# Patient Record
Sex: Female | Born: 1990 | Race: White | Hispanic: No | Marital: Single | Smoking: Never smoker
Health system: Southern US, Community
[De-identification: ages and names within clinical notes are randomized; demographics above are authoritative.]

## PROBLEM LIST (undated history)

## (undated) ENCOUNTER — Inpatient Hospital Stay (HOSPITAL_COMMUNITY): Payer: Self-pay

## (undated) DIAGNOSIS — G43909 Migraine, unspecified, not intractable, without status migrainosus: Secondary | ICD-10-CM

## (undated) DIAGNOSIS — R51 Headache: Secondary | ICD-10-CM

## (undated) DIAGNOSIS — I8393 Asymptomatic varicose veins of bilateral lower extremities: Secondary | ICD-10-CM

## (undated) DIAGNOSIS — D649 Anemia, unspecified: Secondary | ICD-10-CM

## (undated) HISTORY — PX: WISDOM TOOTH EXTRACTION: SHX21

## (undated) HISTORY — PX: ROOT CANAL: SHX2363

---

## 2011-05-30 ENCOUNTER — Other Ambulatory Visit: Payer: Self-pay | Admitting: Sports Medicine

## 2011-05-30 DIAGNOSIS — M25572 Pain in left ankle and joints of left foot: Secondary | ICD-10-CM

## 2011-05-31 ENCOUNTER — Other Ambulatory Visit: Payer: Self-pay

## 2011-06-03 ENCOUNTER — Ambulatory Visit
Admission: RE | Admit: 2011-06-03 | Discharge: 2011-06-03 | Disposition: A | Discharge status: Home / Self Care | Payer: BC Managed Care – PPO | Source: Ambulatory Visit | Attending: Sports Medicine | Admitting: Sports Medicine

## 2011-06-03 DIAGNOSIS — M25572 Pain in left ankle and joints of left foot: Secondary | ICD-10-CM

## 2011-09-25 ENCOUNTER — Encounter (HOSPITAL_BASED_OUTPATIENT_CLINIC_OR_DEPARTMENT_OTHER): Payer: Self-pay | Admitting: *Deleted

## 2011-10-03 ENCOUNTER — Ambulatory Visit (HOSPITAL_BASED_OUTPATIENT_CLINIC_OR_DEPARTMENT_OTHER): Payer: BC Managed Care – PPO | Admitting: Certified Registered Nurse Anesthetist

## 2011-10-03 ENCOUNTER — Encounter (HOSPITAL_BASED_OUTPATIENT_CLINIC_OR_DEPARTMENT_OTHER): Payer: Self-pay | Admitting: Certified Registered Nurse Anesthetist

## 2011-10-03 ENCOUNTER — Ambulatory Visit (HOSPITAL_BASED_OUTPATIENT_CLINIC_OR_DEPARTMENT_OTHER)
Admission: RE | Admit: 2011-10-03 | Discharge: 2011-10-03 | Disposition: A | Discharge status: Home / Self Care | Payer: BC Managed Care – PPO | Source: Ambulatory Visit | Attending: Orthopedic Surgery | Admitting: Orthopedic Surgery

## 2011-10-03 ENCOUNTER — Encounter (HOSPITAL_BASED_OUTPATIENT_CLINIC_OR_DEPARTMENT_OTHER): Payer: Self-pay

## 2011-10-03 ENCOUNTER — Encounter (HOSPITAL_BASED_OUTPATIENT_CLINIC_OR_DEPARTMENT_OTHER)
Admission: RE | Disposition: A | Discharge status: Home / Self Care | Payer: Self-pay | Source: Ambulatory Visit | Attending: Orthopedic Surgery

## 2011-10-03 DIAGNOSIS — M25373 Other instability, unspecified ankle: Secondary | ICD-10-CM

## 2011-10-03 DIAGNOSIS — M24176 Other articular cartilage disorders, unspecified foot: Secondary | ICD-10-CM | POA: Insufficient documentation

## 2011-10-03 DIAGNOSIS — M249 Joint derangement, unspecified: Secondary | ICD-10-CM | POA: Insufficient documentation

## 2011-10-03 HISTORY — DX: Headache: R51

## 2011-10-03 HISTORY — PX: ANKLE RECONSTRUCTION: SHX1151

## 2011-10-03 SURGERY — RECONSTRUCTION, ANKLE
Anesthesia: General | Site: Ankle | Laterality: Left | Wound class: Clean

## 2011-10-03 MED ORDER — LIDOCAINE HCL (CARDIAC) 20 MG/ML IV SOLN
INTRAVENOUS | Status: DC | PRN
  Administered 2011-10-03: 40 mg via INTRAVENOUS

## 2011-10-03 MED ORDER — SODIUM CHLORIDE 0.9 % IV SOLN: INTRAVENOUS | Status: DC

## 2011-10-03 MED ORDER — ONDANSETRON HCL 4 MG/2ML IJ SOLN
INTRAMUSCULAR | Status: DC | PRN
  Administered 2011-10-03: 4 mg via INTRAVENOUS

## 2011-10-03 MED ORDER — MIDAZOLAM HCL 5 MG/5ML IJ SOLN
INTRAMUSCULAR | Status: DC | PRN
  Administered 2011-10-03: 1 mg via INTRAVENOUS

## 2011-10-03 MED ORDER — FENTANYL CITRATE 0.05 MG/ML IJ SOLN
50.0000 ug | INTRAMUSCULAR | Status: DC | PRN
  Administered 2011-10-03: 100 ug via INTRAVENOUS

## 2011-10-03 MED ORDER — OXYCODONE HCL 5 MG PO TABS: 5.0000 mg | ORAL_TABLET | ORAL | Status: AC | PRN

## 2011-10-03 MED ORDER — SUCCINYLCHOLINE CHLORIDE 20 MG/ML IJ SOLN
INTRAMUSCULAR | Status: DC | PRN
  Administered 2011-10-03: 80 mg via INTRAVENOUS

## 2011-10-03 MED ORDER — CHLORHEXIDINE GLUCONATE 4 % EX LIQD: 60.0000 mL | Freq: Once | CUTANEOUS | Status: DC

## 2011-10-03 MED ORDER — LIDOCAINE HCL 1 % IJ SOLN
INTRAMUSCULAR | Status: DC | PRN
  Administered 2011-10-03: 2 mL via INTRADERMAL

## 2011-10-03 MED ORDER — PROMETHAZINE HCL 12.5 MG PO TABS: 12.5000 mg | ORAL_TABLET | Freq: Four times a day (QID) | ORAL | Status: DC | PRN

## 2011-10-03 MED ORDER — MIDAZOLAM HCL 2 MG/2ML IJ SOLN
0.5000 mg | INTRAMUSCULAR | Status: DC | PRN
  Administered 2011-10-03: 2 mg via INTRAVENOUS

## 2011-10-03 MED ORDER — CEFAZOLIN SODIUM-DEXTROSE 2-3 GM-% IV SOLR
2.0000 g | INTRAVENOUS | Status: AC
  Administered 2011-10-03: 1 g via INTRAVENOUS

## 2011-10-03 MED ORDER — LIDOCAINE-EPINEPHRINE 1.5-1:200000 % IJ SOLN
INTRAMUSCULAR | Status: DC | PRN
  Administered 2011-10-03: 20 mL via INTRADERMAL

## 2011-10-03 MED ORDER — ASCRIPTIN 325 MG PO TABS: 1.0000 | ORAL_TABLET | Freq: Two times a day (BID) | ORAL | Status: DC

## 2011-10-03 MED ORDER — LACTATED RINGERS IV SOLN
INTRAVENOUS | Status: DC
  Administered 2011-10-03 (×3): via INTRAVENOUS

## 2011-10-03 MED ORDER — DEXAMETHASONE SODIUM PHOSPHATE 4 MG/ML IJ SOLN
INTRAMUSCULAR | Status: DC | PRN
  Administered 2011-10-03: 10 mg via INTRAVENOUS

## 2011-10-03 MED ORDER — ROPIVACAINE HCL 5 MG/ML IJ SOLN
INTRAMUSCULAR | Status: DC | PRN
  Administered 2011-10-03: 20 mL via EPIDURAL

## 2011-10-03 MED ORDER — PROPOFOL 10 MG/ML IV EMUL
INTRAVENOUS | Status: DC | PRN
  Administered 2011-10-03: 200 mg via INTRAVENOUS

## 2011-10-03 SURGICAL SUPPLY — 70 items
3.5 x 10mm suture anchor, corkscrew (Anchor) ×4 IMPLANT
BAG DECANTER FOR FLEXI CONT (MISCELLANEOUS) IMPLANT
BANDAGE ESMARK 6X9 LF (GAUZE/BANDAGES/DRESSINGS) ×1 IMPLANT
BLADE SURG 15 STRL LF DISP TIS (BLADE) ×2 IMPLANT
BLADE SURG 15 STRL SS (BLADE) ×2
BNDG COHESIVE 4X5 TAN STRL (GAUZE/BANDAGES/DRESSINGS) ×2 IMPLANT
BNDG COHESIVE 6X5 TAN STRL LF (GAUZE/BANDAGES/DRESSINGS) ×2 IMPLANT
BNDG ESMARK 4X9 LF (GAUZE/BANDAGES/DRESSINGS) IMPLANT
BNDG ESMARK 6X9 LF (GAUZE/BANDAGES/DRESSINGS) ×2
CHLORAPREP W/TINT 26ML (MISCELLANEOUS) ×2 IMPLANT
COVER TABLE BACK 60X90 (DRAPES) ×2 IMPLANT
CUFF TOURNIQUET SINGLE 18IN (TOURNIQUET CUFF) IMPLANT
CUFF TOURNIQUET SINGLE 34IN LL (TOURNIQUET CUFF) ×2 IMPLANT
DECANTER SPIKE VIAL GLASS SM (MISCELLANEOUS) IMPLANT
DRAPE EXTREMITY T 121X128X90 (DRAPE) ×2 IMPLANT
DRAPE INCISE IOBAN 66X45 STRL (DRAPES) IMPLANT
DRAPE OEC MINIVIEW 54X84 (DRAPES) IMPLANT
DRAPE SURG 17X23 STRL (DRAPES) IMPLANT
DRAPE U-SHAPE 47X51 STRL (DRAPES) ×2 IMPLANT
DRAPE UTILITY W/TAPE 26X15 (DRAPES) ×2 IMPLANT
DRSG EMULSION OIL 3X3 NADH (GAUZE/BANDAGES/DRESSINGS) ×2 IMPLANT
DRSG PAD ABDOMINAL 8X10 ST (GAUZE/BANDAGES/DRESSINGS) ×4 IMPLANT
ELECT REM PT RETURN 9FT ADLT (ELECTROSURGICAL) ×2
ELECTRODE REM PT RTRN 9FT ADLT (ELECTROSURGICAL) ×1 IMPLANT
GLOVE BIO SURGEON STRL SZ 6.5 (GLOVE) ×2 IMPLANT
GLOVE BIO SURGEON STRL SZ8 (GLOVE) ×2 IMPLANT
GLOVE BIOGEL PI IND STRL 7.0 (GLOVE) ×1 IMPLANT
GLOVE BIOGEL PI IND STRL 8 (GLOVE) ×1 IMPLANT
GLOVE BIOGEL PI INDICATOR 7.0 (GLOVE) ×1
GLOVE BIOGEL PI INDICATOR 8 (GLOVE) ×1
GLOVE EXAM NITRILE PF MED BLUE (GLOVE) ×2 IMPLANT
GOWN PREVENTION PLUS XLARGE (GOWN DISPOSABLE) ×2 IMPLANT
GOWN PREVENTION PLUS XXLARGE (GOWN DISPOSABLE) ×2 IMPLANT
KWIRE 4.0 X .062IN (WIRE) IMPLANT
NDL SUT 6 .5 CRC .975X.05 MAYO (NEEDLE) ×1 IMPLANT
NEEDLE HYPO 22GX1.5 SAFETY (NEEDLE) IMPLANT
NEEDLE MAYO TAPER (NEEDLE) ×1
PACK BASIN DAY SURGERY FS (CUSTOM PROCEDURE TRAY) ×2 IMPLANT
PAD CAST 4YDX4 CTTN HI CHSV (CAST SUPPLIES) ×1 IMPLANT
PADDING CAST ABS 4INX4YD NS (CAST SUPPLIES) ×1
PADDING CAST ABS COTTON 4X4 ST (CAST SUPPLIES) ×1 IMPLANT
PADDING CAST COTTON 4X4 STRL (CAST SUPPLIES) ×1
PADDING CAST COTTON 6X4 STRL (CAST SUPPLIES) ×2 IMPLANT
PENCIL BUTTON HOLSTER BLD 10FT (ELECTRODE) ×2 IMPLANT
SHEET MEDIUM DRAPE 40X70 STRL (DRAPES) ×2 IMPLANT
SPLINT FAST PLASTER 5X30 (CAST SUPPLIES)
SPLINT PLASTER CAST FAST 5X30 (CAST SUPPLIES) IMPLANT
SPONGE GAUZE 4X4 12PLY (GAUZE/BANDAGES/DRESSINGS) ×2 IMPLANT
SPONGE LAP 18X18 X RAY DECT (DISPOSABLE) ×2 IMPLANT
STAPLER VISISTAT 35W (STAPLE) IMPLANT
STOCKINETTE 6  STRL (DRAPES) ×1
STOCKINETTE 6 STRL (DRAPES) ×1 IMPLANT
STRIP CLOSURE SKIN 1/2X4 (GAUZE/BANDAGES/DRESSINGS) ×2 IMPLANT
SUCTION FRAZIER TIP 10 FR DISP (SUCTIONS) ×2 IMPLANT
SUT ETHIBOND 0 MO6 C/R (SUTURE) IMPLANT
SUT ETHIBOND 2 OS 4 DA (SUTURE) IMPLANT
SUT MNCRL AB 3-0 PS2 18 (SUTURE) ×2 IMPLANT
SUT MNCRL AB 4-0 PS2 18 (SUTURE) IMPLANT
SUT PROLENE 3 0 PS 2 (SUTURE) ×2 IMPLANT
SUT VIC AB 0 CT1 27 (SUTURE)
SUT VIC AB 0 CT1 27XBRD ANBCTR (SUTURE) IMPLANT
SUT VIC AB 0 SH 27 (SUTURE) ×2 IMPLANT
SUT VIC AB 2-0 SH 18 (SUTURE) IMPLANT
SUT VIC AB 2-0 SH 27 (SUTURE)
SUT VIC AB 2-0 SH 27XBRD (SUTURE) IMPLANT
SUT VICRYL 4-0 PS2 18IN ABS (SUTURE) IMPLANT
SYR BULB 3OZ (MISCELLANEOUS) ×2 IMPLANT
TUBE CONNECTING 20X1/4 (TUBING) ×2 IMPLANT
UNDERPAD 30X30 INCONTINENT (UNDERPADS AND DIAPERS) ×2 IMPLANT
WATER STERILE IRR 1000ML POUR (IV SOLUTION) IMPLANT

## 2011-10-03 NOTE — Anesthesia Preprocedure Evaluation (Signed)
Anesthesia Evaluation  Patient identified by MRN, date of birth, ID band Patient awake    Reviewed: Allergy & Precautions, H&P , NPO status , Patient's Chart, lab work & pertinent test results, reviewed documented beta blocker date and time   Airway Mallampati: II TM Distance: >3 FB Neck ROM: full    Dental   Pulmonary neg pulmonary ROS,          Cardiovascular negative cardio ROS      Neuro/Psych negative neurological ROS  negative psych ROS   GI/Hepatic negative GI ROS, Neg liver ROS,   Endo/Other  negative endocrine ROS  Renal/GU negative Renal ROS  negative genitourinary   Musculoskeletal   Abdominal   Peds  Hematology negative hematology ROS (+)   Anesthesia Other Findings See surgeon's H&P   Reproductive/Obstetrics negative OB ROS                           Anesthesia Physical Anesthesia Plan  ASA: I  Anesthesia Plan: General   Post-op Pain Management:    Induction: Intravenous  Airway Management Planned: LMA  Additional Equipment:   Intra-op Plan:   Post-operative Plan: Extubation in OR  Informed Consent: I have reviewed the patients History and Physical, chart, labs and discussed the procedure including the risks, benefits and alternatives for the proposed anesthesia with the patient or authorized representative who has indicated his/her understanding and acceptance.     Plan Discussed with: CRNA and Surgeon  Anesthesia Plan Comments:         Anesthesia Quick Evaluation  

## 2011-10-03 NOTE — Discharge Instructions (Addendum)
Regional Anesthesia Blocks  1. Numbness or the inability to move the "blocked" extremity may last from 3-48 hours after placement. The length of time depends on the medication injected and your individual response to the medication. If the numbness is not going away after 48 hours, call your surgeon.  2. The extremity that is blocked will need to be protected until the numbness is gone and the  Strength has returned. Because you cannot feel it, you will need to take extra care to avoid injury. Because it may be weak, you may have difficulty moving it or using it. You may not know what position it is in without looking at it while the block is in effect.  3. For blocks in the legs and feet, returning to weight bearing and walking needs to be done carefully. You will need to wait until the numbness is entirely gone and the strength has returned. You should be able to move your leg and foot normally before you try and bear weight or walk. You will need someone to be with you when you first try to ensure you do not fall and possibly risk injury.  4. Bruising and tenderness at the needle site are common side effects and will resolve in a few days.  5. Persistent numbness or new problems with movement should be communicated to the surgeon or the [facility] [phone]).     Toni Arthurs, MD [facility]  Please read the following information regarding your care after surgery.  Medications  You only need a prescription for the narcotic pain medicine (ex. oxycodone, Percocet, Norco).  All of the other medicines listed below are available over the counter. X acetominophen (Tylenol) 650 mg every 4-6 hours as you need for minor pain X oxycodone as prescribed for moderate to severe pain X phenergan as needed for nausea   Narcotic pain medicine (ex. oxycodone, Percocet, Vicodin) will cause constipation.  To prevent this problem, take the following medicines while you are taking  any pain medicine. X docusate sodium (Colace) 100 mg twice a day X senna (Senokot) 2 tablets twice a day  X To help prevent blood clots, take an aspirin (325 mg) twice a day for two weeks after surgery.  You should also get up every hour while you are awake to move around.    Weight Bearing ? Bear weight when you are able on your operated leg or foot. ? Bear weight only on the heel of your operated foot in the post-op shoe. X Do not bear any weight on the operated leg or foot.  Cast / Splint / Dressing X Keep your splint or cast clean and dry.  Don't put anything (coat hanger, pencil, etc) down inside of it.  If it gets damp, use a hair dryer on the cool setting to dry it.  If it gets soaked, call the office to schedule an appointment for a cast change. ? Remove your dressing 3 days after surgery and cover the incisions with dry dressings.    After your dressing, cast or splint is removed; you may shower, but do not soak or scrub the wound.  Allow the water to run over it, and then gently pat it dry.  Swelling It is normal for you to have swelling where you had surgery.  To reduce swelling and pain, keep your toes above your nose for at least 3 days after surgery.  It may be necessary to keep your foot or leg elevated  for several weeks.  If it hurts, it should be elevated.    [facility]  [address] [place], [place] [address] [phone]   Post Anesthesia Home Care Instructions  Activity: Get plenty of rest for the remainder of the day. A responsible adult should stay with you for 24 hours following the procedure.  For the next 24 hours, DO NOT: -Drive a car -Advertising copywriter -Drink alcoholic beverages -Take any medication unless instructed by your physician -Make any legal decisions or sign important papers.  Meals: Start with liquid foods such as gelatin or soup. Progress to regular foods as tolerated. Avoid greasy, spicy, heavy foods. If nausea  and/or vomiting occur, drink only clear liquids until the nausea and/or vomiting subsides. Call your physician if vomiting continues.  Special Instructions/Symptoms: Your throat may feel dry or sore from the anesthesia or the breathing tube placed in your throat during surgery. If this causes discomfort, gargle with warm salt water. The discomfort should disappear within 24 hours.   Follow Up Call my office at [phone] when you are discharged from the hospital or surgery center to schedule an appointment to be seen two weeks after surgery.  Call my office at [phone] if you develop a fever >101.5 F, nausea, vomiting, bleeding from the surgical site or severe pain.

## 2011-10-03 NOTE — Progress Notes (Signed)
Assisted Dr. Frederick with left, ultrasound guided, popliteal/saphenous block. Side rails up, monitors on throughout procedure. See vital signs in flow sheet. Tolerated Procedure well. 

## 2011-10-03 NOTE — Brief Op Note (Signed)
10/03/2011  9:03 AM  PATIENT:  Michelle Ortiz  21 y.o. female  PRE-OPERATIVE DIAGNOSIS:  left ankle chronic instability  POST-OPERATIVE DIAGNOSIS:  left ankle chronic instability  Procedure(s): Left ankle lateral ligament reconstruction (modified Brostrum)  SURGEON:  Toni Arthurs, MD  ASSISTANT: n/a  ANESTHESIA:   General, regional  EBL:  minimal   TOURNIQUET:   Total Tourniquet Time Documented: Thigh (Left) - 58 minutes  COMPLICATIONS:  None apparent  DISPOSITION:  Extubated, awake and stable to recovery.  DICTATION ID:  [id number]

## 2011-10-03 NOTE — H&P (Signed)
Michelle Ortiz is an 21 y.o. female.   Chief Complaint: left ankle instability HPI: 21 y/o female with many left ankle sprains in the past.  Sprains less common with ankle bracing, but ankle still painful despite physical therapy.  She desires surgical treatment for this condition.  Past Medical History  Diagnosis Date  . Headache HEADACHES AS TEEN NONE IN 3 YEARS    Past Surgical History  Procedure Date  . No past surgeries     History reviewed. No pertinent family history. Social History:  reports that she has never smoked. She has never used smokeless tobacco. She reports that she does not drink alcohol or use illicit drugs.  Allergies: No Known Allergies  Medications Prior to Admission  Medication Dose Route Frequency Provider Last Rate Last Dose  . fentaNYL (SUBLIMAZE) injection 50-100 mcg  50-100 mcg Intravenous PRN Michelle Goltz, MD      . lactated ringers infusion   Intravenous Continuous Michelle Goltz, MD      . midazolam (VERSED) injection 0.5-2 mg  0.5-2 mg Intravenous PRN Michelle Goltz, MD       Medications Prior to Admission  Medication Sig Dispense Refill  . levonorgestrel-ethinyl estradiol (SEASONALE,INTROVALE,JOLESSA) 0.15-0.03 MG tablet Take 1 tablet by mouth daily.        No results found for this or any previous visit (from the past 48 hour(s)). No results found.  ROS  No recent f/c/n/v/wt loss.  Blood pressure 123/60, pulse 86, temperature 98.5 F (36.9 C), temperature source Oral, resp. rate 20, height 5\' 9"  (1.753 m), weight 65.772 kg (145 lb), last menstrual period 09/04/2011, SpO2 100.00%. Physical Exam wn wd female in nad.  L ankle without swelling or effusion.  A and O x 4.  Mood and affect normal.  EOMI.  Resp unlabored.  5/5 strength about the ankle.  Grade 2 ant drawer in neutral and PF.  15 DF at ankle with knee extended.  Assessment/Plan L ankle chronic instability - Michelle Ortiz has failed non-operative treatment with  activity modification, PT and bracing.  She still has recurrent episodes of instability and desires surgical treatment.  The risks and benefits of the alternative treatment options have been discussed in detail.  The patient wishes to proceed with surgery and specifically understands risks of bleeding, infection, nerve damage, blood clots, need for additional surgery, amputation and death.   Michelle Ortiz 25-Oct-2011, 7:10 AM

## 2011-10-03 NOTE — Anesthesia Postprocedure Evaluation (Signed)
Anesthesia Post Note  Patient: Michelle Ortiz  Procedure(s) Performed: Procedure(s) (LRB): RECONSTRUCTION ANKLE (Left)  Anesthesia type: general  Patient location: PACU  Post pain: Pain level controlled  Post assessment: Patient's Cardiovascular Status Stable  Last Vitals:  Filed Vitals:   10/03/11 0930  BP: 121/73  Pulse: 93  Temp:   Resp: 14    Post vital signs: Reviewed and stable  Level of consciousness: sedated  Complications: No apparent anesthesia complications

## 2011-10-03 NOTE — Anesthesia Procedure Notes (Addendum)
Anesthesia Regional Block:  Popliteal block  Pre-Anesthetic Checklist: ,, timeout performed, Correct Patient, Correct Site, Correct Laterality, Correct Procedure, Correct Position, site marked, Risks and benefits discussed,  Surgical consent,  Pre-op evaluation,  At surgeon's request and post-op pain management  Laterality: Left  Prep: chloraprep       Needles:   Needle Type: Other   (Arrow Echogenic)   Needle Length: 9cm  Needle Gauge: 21    Additional Needles:  Procedures: ultrasound guided Popliteal block Narrative:  Start time: 10/03/2011 7:02 AM End time: 10/03/2011 7:10 AM Injection made incrementally with aspirations every 5 mL.  Performed by: Personally  Anesthesiologist: Aldona Lento, MD  Additional Notes: Ultrasound guidance used to: id relevant anatomy, confirm needle position, local anesthetic spread, avoidance of vascular puncture. Picture saved. No complications. Block performed personally by Janetta Hora. Gelene Mink, MD  .    Popliteal block Procedure Name: Intubation Date/Time: 10/03/2011 7:33 AM Performed by: Gerell Fortson D Pre-anesthesia Checklist: Patient identified, Emergency Drugs available, Suction available and Patient being monitored Patient Re-evaluated:Patient Re-evaluated prior to inductionOxygen Delivery Method: Circle System Utilized Preoxygenation: Pre-oxygenation with 100% oxygen Intubation Type: IV induction Ventilation: Mask ventilation without difficulty Laryngoscope Size: Mac and 3 Grade View: Grade I Tube type: Oral Tube size: 7.0 mm Number of attempts: 1 Airway Equipment and Method: stylet and oral airway Placement Confirmation: ETT inserted through vocal cords under direct vision,  positive ETCO2 and breath sounds checked- equal and bilateral Secured at: 21 cm Tube secured with: Tape Dental Injury: Teeth and Oropharynx as per pre-operative assessment  Comments: LTA utilized prior to intubation. T Norinne Jeane CRNA

## 2011-10-03 NOTE — Transfer of Care (Signed)
Immediate Anesthesia Transfer of Care Note  Patient: Michelle Ortiz  Procedure(s) Performed: Procedure(s) (LRB): RECONSTRUCTION ANKLE (Left)  Patient Location: PACU  Anesthesia Type: GA combined with regional for post-op pain  Level of Consciousness: awake, alert , oriented and patient cooperative  Airway & Oxygen Therapy: Patient Spontanous Breathing and Patient connected to face mask oxygen  Post-op Assessment: Report given to PACU RN and Post -op Vital signs reviewed and stable  Post vital signs: Reviewed and stable  Complications: No apparent anesthesia complications

## 2011-10-04 ENCOUNTER — Encounter (HOSPITAL_BASED_OUTPATIENT_CLINIC_OR_DEPARTMENT_OTHER): Payer: Self-pay | Admitting: Orthopedic Surgery

## 2011-10-04 NOTE — Op Note (Signed)
NAME:  Michelle Ortiz, Michelle Ortiz                      ACCOUNT NO.:  MEDICAL RECORD NO.:  [id number]  LOCATION:                                 FACILITY:  PHYSICIAN:  Toni Arthurs, MD             DATE OF BIRTH:  DATE OF PROCEDURE:  10/03/2011 DATE OF DISCHARGE:                              OPERATIVE REPORT   PREOPERATIVE DIAGNOSIS:  Left ankle chronic instability.  POSTOPERATIVE DIAGNOSIS:  Left ankle chronic instability.  PROCEDURE:  Left ankle lateral ligament reconstruction (modified Brostrom).  SURGEON:  Toni Arthurs, M.D.  ANESTHESIA:  General, regional.  ESTIMATED BLOOD LOSS:  Minimal.  TOURNIQUET TIME:  58 minutes at 250 mmHg.  COMPLICATIONS:  None apparent.  DISPOSITION:  Extubated, awake, and stable to recovery.  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  She sustained multiple left ankle injuries.  She has developed chronic instability and has failed treatment with bracing, activity modification, and physical therapy.  She presents now for operative treatment of this condition. She understands the risks and benefits, the alternative treatment options, and elects operative treatment.  She specifically understands risks of bleeding, infection, nerve damage, blood clots, need for additional surgery, amputation, and death.  PROCEDURE IN DETAIL:  After preoperative consent was obtained and the correct operative site was identified, the patient was brought to the operating room and placed supine on the operating table.  General anesthesia was induced.  Preoperative antibiotics were administered. Surgical time-out was taken.  The patient was then turned into the lateral decubitus position with the left side up.  The left lower extremity was prepped and draped in standard sterile fashion with a tourniquet around the thigh.  An incision was marked on the skin obliquely across the distal fibula.  The  extremity was exsanguinated and the tourniquet was inflated to 250 mmHg.  A lateral incision was made and sharp dissection was carried down through the skin.  Blunt dissection was carried down through the subcutaneous tissue to the level of the distal fibula.  Extensor retinaculum was identified and was mobilized.  The ATFL was noted to be completely torn off of the anterior aspect of the distal fibula.  The CFL was also noted to be torn and retracted posteriorly.  The remaining periosteum on the distal-lateral aspect of the fibula was elevated as a flap proximally and a rongeur was then used to debride the cortical bone exposing healthy medullary bone. A guide pin for the Arthrex 3.5 mm Corkscrew Anchor was then drilled into the distal aspect of the fibula in the location of the calcaneofibular ligament origin.  A second hole was made at the origin of the anterior talofibular ligament.  Two Arthrex 3.5 mm anchors were inserted in these 2 holes.  The first set of suture at the distal anchor was placed through the calcaneofibular ligament posteriorly and the second set through the calcaneofibular ligament anteriorly.  The 2 pairs of sutures for the more proximal anchor were then passed through the anterior talofibular ligament inferiorly and superiorly.  With the hindfoot held  in a reduced and everted position, the sutures were tied down in horizontal mattress fashion pulling the calcaneofibular ligament and the anterior talofibular ligament up on to the bleeding bony surface of the fibula.  These sutures were all tied down and the 4 pairs of sutures were passed through the extensor retinaculum from inferior to superior.  These were similarly tied down advancing the extensor retinaculum onto the cut surface of the fibula over the ATFL and CFL ligaments.  A 0 Vicryl imbricating sutures were then passed through the leading edge of the elevated periosteal flap and then tied down to  the extensor retinaculum.  This covered the FiberWire knots from the previously placed suture anchors.  The peroneal tendons had previously been examined and were noted to be healthy with no evidence of tearing or synovitis.  The lateral aspect of the talus had been examined and was similarly noted to be healthy with no evidence of significant synovitis or chondral defect.  The subcutaneous tissue was approximated with inverted simple sutures of 3-0 Monocryl and a running subcuticular 3-0 Prolene suture was used to close the skin.  Steri-Strips were applied. Sterile dressings were applied followed by a well-padded short-leg splint.  Tourniquet was released at 58 minutes after application of the dressings.  The patient was then awake from anesthesia and transported to the recovery room in stable condition.  FOLLOWUP PLAN:  The patient will be discharging home today in the care of her parents.  She will be nonweightbearing on her left lower extremity.  She will start aspirin 325 mg p.o. b.i.d. as prophylaxis against DVT.  She will follow up with me in 2 weeks for suture removal.     Toni Arthurs, MD     JH/MEDQ  D:  10/03/2011  T:  10/03/2011  Job:  [id number]

## 2011-10-09 ENCOUNTER — Encounter (HOSPITAL_BASED_OUTPATIENT_CLINIC_OR_DEPARTMENT_OTHER): Payer: Self-pay

## 2012-10-08 ENCOUNTER — Encounter: Payer: Self-pay | Admitting: Emergency Medicine

## 2012-10-08 ENCOUNTER — Emergency Department
Admission: EM | Admit: 2012-10-08 | Discharge: 2012-10-08 | Disposition: A | Discharge status: Home / Self Care | Payer: BC Managed Care – PPO | Source: Home / Self Care | Attending: Family Medicine | Admitting: Family Medicine

## 2012-10-08 ENCOUNTER — Emergency Department (INDEPENDENT_AMBULATORY_CARE_PROVIDER_SITE_OTHER): Payer: BC Managed Care – PPO

## 2012-10-08 DIAGNOSIS — R1110 Vomiting, unspecified: Secondary | ICD-10-CM

## 2012-10-08 DIAGNOSIS — R0602 Shortness of breath: Secondary | ICD-10-CM

## 2012-10-08 DIAGNOSIS — R197 Diarrhea, unspecified: Secondary | ICD-10-CM

## 2012-10-08 DIAGNOSIS — A088 Other specified intestinal infections: Secondary | ICD-10-CM

## 2012-10-08 DIAGNOSIS — A084 Viral intestinal infection, unspecified: Secondary | ICD-10-CM

## 2012-10-08 MED ORDER — ONDANSETRON 4 MG PO TBDP: 4.0000 mg | ORAL_TABLET | Freq: Three times a day (TID) | ORAL | Status: DC | PRN

## 2012-10-08 NOTE — ED Notes (Signed)
Vomiting and diarrhea x 2 days, started with a migraine on Tuesday, body aches, abdominal pain, dull headache

## 2012-10-08 NOTE — ED Provider Notes (Signed)
History     CSN: [id number]  Arrival date & time 10/08/12  1415   First MD Initiated Contact with Patient 10/08/12 1432      Chief Complaint  Patient presents with  . Emesis   HPI  Diarrhea: Onset: 2 days Description: wattery diarrhea, NBNB emesis, headache, mild abdominal pain, also with some SOB (no fevers, chills, no recent travel or surgeries. Is on OCPs. No family hx/o blood clots).  Modifying factors: as above    Symptoms: Incontinence: yes Vomiting: yes Abdominal Pain: mild Urgency: no Relief with defecation: mild Weight loss: yes Decreased urine output: yes Lightheadedness: mild Recent travel history: normal  Sick contacts: yes Suspicious food or water: unsure Change in diet: drinking only liquids.     Red Flags: Fever: no Bloody stools: no Recent antibiotics: no Immuncompromised: no   Past Medical History  Diagnosis Date  . Headache HEADACHES AS TEEN NONE IN 3 YEARS    Past Surgical History  Procedure Laterality Date  . No past surgeries    . Ankle reconstruction  10/03/2011    Procedure: RECONSTRUCTION ANKLE;  Surgeon: Toni Arthurs, MD;  Location: [facility];  Service: Orthopedics;  Laterality: Left;  left ankle modified brostrum lateral ligament reconstruction    Family History  Problem Relation Age of Onset  . Hypertension Mother   . Thyroid disease Mother   . Hypertension Father   . Thyroid disease Sister     History  Substance Use Topics  . Smoking status: Never Smoker   . Smokeless tobacco: Never Used  . Alcohol Use: No    OB History   Grav Para Term Preterm Abortions TAB SAB Ect Mult Living                  Review of Systems  All other systems reviewed and are negative.    Allergies  Review of patient's allergies indicates not on file.  Home Medications   Current Outpatient Rx  Name  Route  Sig  Dispense  Refill  . Aspirin Buf,AlHyd-MgHyd-CaCar, (ASCRIPTIN) 325 MG TABS   Oral   Take 1 tablet by mouth  2 (two) times daily.   30 each   0   . levonorgestrel-ethinyl estradiol (SEASONALE,INTROVALE,JOLESSA) 0.15-0.03 MG tablet   Oral   Take 1 tablet by mouth daily.           BP 108/76  Pulse 91  Temp(Src) 98 F (36.7 C) (Oral)  Ht 5\' 8"  (1.727 m)  Wt 141 lb (63.957 kg)  BMI 21.44 kg/m2  SpO2 99%  LMP 09/25/2012  Physical Exam  Constitutional: She appears well-developed and well-nourished.  HENT:  Head: Normocephalic and atraumatic.  Right Ear: External ear normal.  Mouth/Throat: Oropharynx is clear and moist.  Eyes: Conjunctivae are normal. Pupils are equal, round, and reactive to light.  Neck: Normal range of motion. Neck supple.  Cardiovascular: Normal rate and regular rhythm.   Pulmonary/Chest: Effort normal and breath sounds normal. She has no wheezes. She has no rales.  Abdominal: Soft. Bowel sounds are normal.  minimal lower abdominal tenderness   Musculoskeletal: Normal range of motion.  Neurological: She is alert.  Skin: Skin is warm.    ED Course  Procedures (including critical care time)  Labs Reviewed - No data to display Dg Chest 2 View  10/08/2012  *RADIOLOGY REPORT*  Clinical Data: Vomiting, shortness of breath, diarrhea  CHEST - 2 VIEW  Comparison: None.  Findings: The lungs are clear.  Mediastinal contours  appear normal. The heart is within normal limits in size.  No bony abnormality is seen.  IMPRESSION: No active lung disease.   Original Report Authenticated By: Dwyane Dee, M.D.      1. Viral gastroenteritis   2. SOB (shortness of breath)       MDM  Likely viral gastroenteritis. Discussed supportive care and GI red flags. Zofran for nausea.  CXR negative for infiltrate. SOB may be related to anxiety as pt does admit this. Given OCP use and dyspnea, will obtain d-dimer to r/o PE. No tachycardia or hypoxia today.  Discussed resp red flags.  Follow up as needed.     The patient and/or caregiver has been counseled thoroughly with regard to  treatment plan and/or medications prescribed including dosage, schedule, interactions, rationale for use, and possible side effects and they verbalize understanding. Diagnoses and expected course of recovery discussed and will return if not improved as expected or if the condition worsens. Patient and/or caregiver verbalized understanding.              Doree Albee, MD 10/08/12 [phone]

## 2012-10-09 ENCOUNTER — Telehealth: Payer: Self-pay | Admitting: *Deleted

## 2012-10-09 LAB — D-DIMER, QUANTITATIVE: D-Dimer, Quant: 0.27 ug/mL-FEU (ref 0.00–0.48)

## 2014-01-09 ENCOUNTER — Ambulatory Visit (INDEPENDENT_AMBULATORY_CARE_PROVIDER_SITE_OTHER): Payer: BC Managed Care – PPO

## 2014-01-09 ENCOUNTER — Ambulatory Visit (INDEPENDENT_AMBULATORY_CARE_PROVIDER_SITE_OTHER): Payer: BC Managed Care – PPO | Admitting: Family Medicine

## 2014-01-09 VITALS — BP 122/70 | HR 81 | Temp 97.9°F | Resp 16 | Ht 69.0 in | Wt 157.6 lb

## 2014-01-09 DIAGNOSIS — M75100 Unspecified rotator cuff tear or rupture of unspecified shoulder, not specified as traumatic: Secondary | ICD-10-CM

## 2014-01-09 DIAGNOSIS — M25511 Pain in right shoulder: Secondary | ICD-10-CM

## 2014-01-09 DIAGNOSIS — S43303A Subluxation of unspecified parts of unspecified shoulder girdle, initial encounter: Secondary | ICD-10-CM

## 2014-01-09 DIAGNOSIS — M67919 Unspecified disorder of synovium and tendon, unspecified shoulder: Secondary | ICD-10-CM

## 2014-01-09 DIAGNOSIS — M719 Bursopathy, unspecified: Secondary | ICD-10-CM

## 2014-01-09 DIAGNOSIS — M25519 Pain in unspecified shoulder: Secondary | ICD-10-CM

## 2014-01-09 DIAGNOSIS — S43086A Other dislocation of unspecified shoulder joint, initial encounter: Secondary | ICD-10-CM

## 2014-01-09 LAB — POCT URINE PREGNANCY: Preg Test, Ur: NEGATIVE

## 2014-01-09 MED ORDER — HYDROCODONE-ACETAMINOPHEN 5-325 MG PO TABS: 1.0000 | ORAL_TABLET | Freq: Four times a day (QID) | ORAL | Status: DC | PRN

## 2014-01-09 NOTE — Progress Notes (Addendum)
° °  Subjective:    Patient ID: Michelle Ortiz, female    DOB: [birth year], 23 y.o.   MRN: [id number]  Shoulder Injury    Chief Complaint  Patient presents with   Shoulder Injury    ?dislocation yesterday throwing the football yesterday.   very painful today    This chart was scribed for Elvina SidleKurt Lauenstein, MD by Andrew Auaven Small, ED Scribe. This patient was seen in room 4 and the patient's care was started at 12:19 PM.  HPI Comments: Michelle Ortiz is a 23 y.o. female who presents to the [facility] complaining of a right shoulder injury x 1 day. She reports she was throwing a football yesterday when she felt a "pop". States she felt the joint pop out and that she pushed it back in. States she had a similar injury 11 months ago. She now reports discomfort in her right shoulder and hand. She  Has a hard time lifting arm. Pt denies being pregnant and is not on birth control.   There are no active problems to display for this patient.  Past Medical History  Diagnosis Date   Headache(784.0) HEADACHES AS TEEN NONE IN 3 YEARS   No Known Allergies Prior to Admission medications   Not on File   Review of Systems  Musculoskeletal: Positive for arthralgias and myalgias.      Objective:   Physical Exam  Nursing note and vitals reviewed. Constitutional: She is oriented to person, place, and time. She appears well-developed and well-nourished. No distress.  HENT:  Head: Normocephalic and atraumatic.  Eyes: Conjunctivae and EOM are normal.  Neck: Normal range of motion.  Cardiovascular: Normal rate.   Pulmonary/Chest: Effort normal.  Musculoskeletal: Normal range of motion.       Right shoulder: She exhibits swelling ( mild).  No ecchymosis  Pt is able to externally rotate slowly and completely with some pain She can abduct to 60 degrees but no further due to pain  Neurological: She is alert and oriented to person, place, and time.  Skin: Skin is warm and dry.  Psychiatric:  She has a normal mood and affect. Her behavior is normal.   [facility] reading (PRIMARY) by  Dr. Milus GlazierLauenstein: right  Shoulder:  Negative.    Assessment & Plan:   Right shoulder pain - Plan: POCT urine pregnancy, DG Shoulder Right, HYDROcodone-acetaminophen (NORCO) 5-325 MG per tablet  Subluxation of shoulder girdle - Plan: HYDROcodone-acetaminophen (NORCO) 5-325 MG per tablet Reviewed rehab exercises. Signed, Elvina SidleKurt Lauenstein, MD

## 2014-04-23 ENCOUNTER — Ambulatory Visit (INDEPENDENT_AMBULATORY_CARE_PROVIDER_SITE_OTHER): Payer: BC Managed Care – PPO | Admitting: Internal Medicine

## 2014-04-23 VITALS — BP 108/69 | HR 60 | Temp 97.6°F | Resp 16 | Ht 68.0 in | Wt 154.6 lb

## 2014-04-23 DIAGNOSIS — N938 Other specified abnormal uterine and vaginal bleeding: Secondary | ICD-10-CM

## 2014-04-23 DIAGNOSIS — N949 Unspecified condition associated with female genital organs and menstrual cycle: Secondary | ICD-10-CM

## 2014-04-23 DIAGNOSIS — N926 Irregular menstruation, unspecified: Secondary | ICD-10-CM

## 2014-04-23 DIAGNOSIS — Z3490 Encounter for supervision of normal pregnancy, unspecified, unspecified trimester: Secondary | ICD-10-CM

## 2014-04-23 DIAGNOSIS — Z331 Pregnant state, incidental: Secondary | ICD-10-CM

## 2014-04-23 LAB — POCT URINE PREGNANCY: PREG TEST UR: POSITIVE

## 2014-04-23 MED ORDER — ONDANSETRON HCL 4 MG PO TABS: 4.0000 mg | ORAL_TABLET | Freq: Three times a day (TID) | ORAL | Status: DC | PRN

## 2014-04-23 MED ORDER — PRENATAL 1 30-0.975-200 MG PO CAPS: 1.0000 | ORAL_CAPSULE | ORAL | Status: DC

## 2014-04-23 NOTE — Progress Notes (Signed)
   Subjective:    Patient ID: Michelle Ortiz, female    DOB: April 24, 1991, 23 y.o.   MRN: [id number]  HPI A 23 year old female is here with an request to have an pregnancy test.  She states that she has already taken 2 test at home which resulted in positive results.  However, she wants to confirm the results here at [facility].  Patient states that her LMP was around March 09, 2014 and it was an normal flow.  She states that within the past few weeks she has been having various symptoms that include vomiting, loss of appetite of 3 days total, brownish discharge and mild breast tenderness.  She has mild fatigue but mentions that her work schedule changed and she is trying to adjust to that.  Patient also states that she normally urinates frequently and can not distinguish if it has increased lately.    Patient states that her and her partner were not planning on the pregnancy; however, they plan to continue with it and wants to be referred over to an OB/GYN office for care.  Prior to Admission medications =none  Medication Sig Start Date End Date Taking? Authorizing Provider   Student teaching and working  No etoh/nonsmoker  Review of Systems noncontrib    Objective:   Physical Exam  Constitutional: She appears well-developed and well-nourished. No distress.  Eyes: EOM are normal. Pupils are equal, round, and reactive to light.  Cardiovascular: Normal rate.   Pulmonary/Chest: Effort normal.  Abdominal: Bowel sounds are normal. She exhibits no distension. There is no tenderness.    Results for orders placed in visit on 04/23/14  POCT URINE PREGNANCY      Result Value Ref Range   Preg Test, Ur Positive      BP 108/69  Pulse 60  Temp(Src) 97.6 F (36.4 C) (Oral)  Resp 16  Ht  (1.727 m)  Wt 154 lb 9.6 oz (70.126 kg)  BMI 23.51 kg/m2  SpO2 100%  LMP 03/09/2014     Assessment & Plan:  Late menstruation - Plan: POCT urine pregnancy  Pregnancy - Plan: Ambulatory referral to  Obstetrics / Gynecology  W/ nausea/sl wt loss  Meds ordered this encounter  Medications  . Prenatal MV-Min-Fe Fum-FA-DHA (PRENATAL 1) 30-0.975-200 MG CAPS    Sig: Take 1 capsule by mouth 1 day or 1 dose.    Dispense:  30 capsule    Refill:  3  . ondansetron (ZOFRAN) 4 MG tablet    Sig: Take 1 tablet (4 mg total) by mouth every 8 (eight) hours as needed for nausea or vomiting.    Dispense:  30 tablet    Refill:  3   I have completed the patient encounter in its entirety as documented by the scribe, with editing by me where necessary. Baldwin Racicot P. Merla Riches, M.D.

## 2014-05-10 ENCOUNTER — Emergency Department (HOSPITAL_COMMUNITY)
Admission: EM | Admit: 2014-05-10 | Discharge: 2014-05-11 | Disposition: A | Discharge status: Home / Self Care | Payer: BC Managed Care – PPO | Attending: Emergency Medicine | Admitting: Emergency Medicine

## 2014-05-10 ENCOUNTER — Emergency Department (HOSPITAL_COMMUNITY): Payer: BC Managed Care – PPO

## 2014-05-10 ENCOUNTER — Encounter (HOSPITAL_COMMUNITY): Payer: Self-pay | Admitting: Emergency Medicine

## 2014-05-10 DIAGNOSIS — O200 Threatened abortion: Secondary | ICD-10-CM

## 2014-05-10 DIAGNOSIS — Z79899 Other long term (current) drug therapy: Secondary | ICD-10-CM | POA: Insufficient documentation

## 2014-05-10 DIAGNOSIS — Z3A00 Weeks of gestation of pregnancy not specified: Secondary | ICD-10-CM | POA: Insufficient documentation

## 2014-05-10 DIAGNOSIS — O209 Hemorrhage in early pregnancy, unspecified: Secondary | ICD-10-CM

## 2014-05-10 DIAGNOSIS — O039 Complete or unspecified spontaneous abortion without complication: Secondary | ICD-10-CM | POA: Diagnosis not present

## 2014-05-10 LAB — I-STAT CHEM 8, ED
BUN: 6 mg/dL (ref 6–23)
CHLORIDE: 102 meq/L (ref 96–112)
CREATININE: 0.7 mg/dL (ref 0.50–1.10)
Calcium, Ion: 1.21 mmol/L (ref 1.12–1.23)
GLUCOSE: 89 mg/dL (ref 70–99)
HEMATOCRIT: 39 % (ref 36.0–46.0)
Hemoglobin: 13.3 g/dL (ref 12.0–15.0)
POTASSIUM: 3.4 meq/L — AB (ref 3.7–5.3)
SODIUM: 138 meq/L (ref 137–147)
TCO2: 22 mmol/L (ref 0–100)

## 2014-05-10 LAB — CBC
HEMATOCRIT: 38.1 % (ref 36.0–46.0)
HEMOGLOBIN: 13.4 g/dL (ref 12.0–15.0)
MCH: 29.1 pg (ref 26.0–34.0)
MCHC: 35.2 g/dL (ref 30.0–36.0)
MCV: 82.8 fL (ref 78.0–100.0)
Platelets: 249 10*3/uL (ref 150–400)
RBC: 4.6 MIL/uL (ref 3.87–5.11)
RDW: 12.6 % (ref 11.5–15.5)
WBC: 8.4 10*3/uL (ref 4.0–10.5)

## 2014-05-10 LAB — ABO/RH: ABO/RH(D): B POS

## 2014-05-10 MED ORDER — MORPHINE SULFATE 4 MG/ML IJ SOLN: 4.0000 mg | Freq: Once | INTRAMUSCULAR | Status: DC

## 2014-05-10 MED ORDER — ONDANSETRON HCL 4 MG/2ML IJ SOLN
4.0000 mg | Freq: Once | INTRAMUSCULAR | Status: AC
  Administered 2014-05-10: 4 mg via INTRAVENOUS
  Filled 2014-05-10: qty 2

## 2014-05-10 MED ORDER — MORPHINE SULFATE 4 MG/ML IJ SOLN
4.0000 mg | Freq: Once | INTRAMUSCULAR | Status: AC
  Administered 2014-05-10: 4 mg via INTRAVENOUS
  Filled 2014-05-10: qty 1

## 2014-05-10 MED ORDER — SODIUM CHLORIDE 0.9 % IV SOLN
Freq: Once | INTRAVENOUS | Status: AC
  Administered 2014-05-10: 23:00:00 via INTRAVENOUS

## 2014-05-10 NOTE — ED Provider Notes (Signed)
CSN: [id number]     Arrival date & time 05/10/14  2204 History   First MD Initiated Contact with Patient 05/10/14 2242     Chief Complaint  Patient presents with  . Vaginal Bleeding  . Possible Pregnancy     (Consider location/radiation/quality/duration/timing/severity/associated sxs/prior Treatment) HPI Comments: Patient with confirmed pregnancy 1 week ago had had continuous nausea and vomiting, fatigue.  Today had spontaneous onset vaginal bleeding with tissue. Initially no cramping but on arrival to ED began having abdominal cramping and back pain.  Patient is a 23 y.o. female presenting with vaginal bleeding and pregnancy problem. The history is provided by the patient.  Vaginal Bleeding Quality:  Bright red and passed tissue Severity:  Mild Onset quality:  Sudden Timing:  Constant Progression:  Unchanged Chronicity:  New Menstrual history:  Regular Number of pads used:  2 Possible pregnancy: yes   Context: spontaneously   Relieved by:  Nothing Worsened by:  Nothing tried Ineffective treatments:  None tried Associated symptoms: abdominal pain, back pain and nausea   Associated symptoms: no dysuria   Risk factors: no hx of ectopic pregnancy, does not have multiple partners, no new sexual partner, no PID, no prior miscarriage, no STD exposure and does not have unprotected sex   Possible Pregnancy  Primary symptoms include abdominal pain and vaginal bleeding.  Associated symptoms include nausea.  Associated symptoms include no dysuria.    Past Medical History  Diagnosis Date  . Headache(784.0) HEADACHES AS TEEN NONE IN 3 YEARS   Past Surgical History  Procedure Laterality Date  . No past surgeries    . Ankle reconstruction  10/03/2011    Procedure: RECONSTRUCTION ANKLE;  Surgeon: Toni ArthursJohn Hewitt, MD;  Location: [facility];  Service: Orthopedics;  Laterality: Left;  left ankle modified brostrum lateral ligament reconstruction   Family History  Problem  Relation Age of Onset  . Hypertension Mother   . Thyroid disease Mother   . Hypertension Father   . Thyroid disease Sister    History  Substance Use Topics  . Smoking status: Never Smoker   . Smokeless tobacco: Never Used  . Alcohol Use: No   OB History   Grav Para Term Preterm Abortions TAB SAB Ect Mult Living                 Review of Systems  Gastrointestinal: Positive for nausea and abdominal pain.  Genitourinary: Positive for vaginal bleeding. Negative for dysuria.  Musculoskeletal: Positive for back pain.      Allergies  Review of patient's allergies indicates no known allergies.  Home Medications   Prior to Admission medications   Medication Sig Start Date End Date Taking? Authorizing Provider  ondansetron (ZOFRAN) 4 MG tablet Take 1 tablet (4 mg total) by mouth every 8 (eight) hours as needed for nausea or vomiting. 04/23/14  Yes Tonye Pearsonobert P Doolittle, MD  Prenatal Vit-Fe Fumarate-FA (PRENATAL MULTIVITAMIN) TABS tablet Take 1 tablet by mouth daily at 12 noon.   Yes Historical Provider, MD  ondansetron (ZOFRAN ODT) 4 MG disintegrating tablet Take 1 tablet (4 mg total) by mouth every 8 (eight) hours as needed for nausea or vomiting. 05/11/14   Arman FilterGail K Manases Etchison, NP   BP 102/59  Pulse 59  Temp(Src) 98.8 F (37.1 C) (Oral)  Resp 18  Ht 5\' 9"  (1.753 m)  Wt 160 lb (72.576 kg)  BMI 23.62 kg/m2  SpO2 100%  LMP 03/09/2014 Physical Exam  Constitutional: She appears well-developed and well-nourished.  HENT:  Head: Normocephalic.  Eyes: Pupils are equal, round, and reactive to light.  Cardiovascular: Normal rate and regular rhythm.   Pulmonary/Chest: Effort normal and breath sounds normal.  Abdominal: Soft. She exhibits no distension. There is tenderness in the suprapubic area.  Genitourinary: Uterus is not tender. Right adnexum displays no tenderness. Left adnexum displays no tenderness.  Cervix open blood and clots in vaginal vault   Musculoskeletal: Normal range of  motion.  Neurological: She is alert.  Skin: Skin is warm.    ED Course  Procedures (including critical care time) Labs Review Labs Reviewed  HCG, QUANTITATIVE, PREGNANCY - Abnormal; Notable for the following:    hCG, Beta Chain, [place] Longest [id number] (*)    All other components within normal limits  I-STAT CHEM 8, ED - Abnormal; Notable for the following:    Potassium 3.4 (*)    All other components within normal limits  CBC  ABO/RH    Imaging Review US Ob Comp Less 14 Wks  05/11/2014   CLINICAL DATA:  Pregnant.  Vaginal bleeding and cramping.  EXAM: OBSTETRIC <14 WK [place] AND TRANSVAGINAL OB US  TECHNIQUE: Both transabdominal and transvaginal ultrasound examinations were performed for complete evaluation of the gestation as well as the maternal uterus, adnexal regions, and pelvic cul-de-sac. Transvaginal technique was performed to assess early pregnancy.  COMPARISON:  None.  FINDINGS: Intrauterine gestational sac: A single normal appearing intrauterine gestational sac is identified. Note is made of a small subchorionic hemorrhage (image 58)  Yolk sac:  Visualized (image 36)  Embryo:  Visualized  Cardiac Activity: Visualized  Heart Rate:  163 bpm  CRL:   18.1  mm   8 w 2d                  [place] EDC: 12/19/2014  Maternal uterus/adnexae:  Normal appearance of the anteverted uterus. The cervix remains closed though there is a minimal amount of echogenic blood within the cervix (image 58)  The right ovary is normal in size and appearance measuring approximately 3.6 x 2.3 x 2.3 cm. Multiple anechoic sub cm follicles are noted within the peripheral aspect of the right ovary.  The left ovary is normal in size and appearance measuring 3.2 x 2.5 x 2.9 cm. Note is made of an approximately 1.9 x 1.0 x 1.3 cm hyperechoic hemorrhagic cyst.  IMPRESSION: 1. Single viable intrauterine gestation with crown-rump length compatible with 8 weeks, 2 days gestation and an estimated delivery date of 12/19/2014. 2. Small amount of  subchorionic hemorrhage with minimal amount of blood within the closed cervix.   Electronically Signed   By: Simonne Come M.D.   On: 05/11/2014 01:02   US Ob Transvaginal  05/11/2014   CLINICAL DATA:  Pregnant.  Vaginal bleeding and cramping.  EXAM: OBSTETRIC <14 WK [place] AND TRANSVAGINAL OB US  TECHNIQUE: Both transabdominal and transvaginal ultrasound examinations were performed for complete evaluation of the gestation as well as the maternal uterus, adnexal regions, and pelvic cul-de-sac. Transvaginal technique was performed to assess early pregnancy.  COMPARISON:  None.  FINDINGS: Intrauterine gestational sac: A single normal appearing intrauterine gestational sac is identified. Note is made of a small subchorionic hemorrhage (image 58)  Yolk sac:  Visualized (image 36)  Embryo:  Visualized  Cardiac Activity: Visualized  Heart Rate:  163 bpm  CRL:   18.1  mm   8 w 2d                  [place]  EDC: 12/19/2014  Maternal uterus/adnexae:  Normal appearance of the anteverted uterus. The cervix remains closed though there is a minimal amount of echogenic blood within the cervix (image 58)  The right ovary is normal in size and appearance measuring approximately 3.6 x 2.3 x 2.3 cm. Multiple anechoic sub cm follicles are noted within the peripheral aspect of the right ovary.  The left ovary is normal in size and appearance measuring 3.2 x 2.5 x 2.9 cm. Note is made of an approximately 1.9 x 1.0 x 1.3 cm hyperechoic hemorrhagic cyst.  IMPRESSION: 1. Single viable intrauterine gestation with crown-rump length compatible with 8 weeks, 2 days gestation and an estimated delivery date of 12/19/2014. 2. Small amount of subchorionic hemorrhage with minimal amount of blood within the closed cervix.   Electronically Signed   By: Simonne ComeJohn  Watts M.D.   On: 05/11/2014 01:02     EKG Interpretation None     Ultrasound report given to patient   Feeling better after fluids  [facility] to [facility] in 2 days  MDM   Final  diagnoses:  Threatened abortion in early pregnancy          Arman FilterGail K Kenedy Haisley, NP 05/11/14 0245

## 2014-05-10 NOTE — ED Notes (Addendum)
2 months pregnant. Pregnancy confirmed by GP. Taking prenatal vitamins. Here for vaginal bleeding noted 30 minutes PTA. Blood was running down leg, clots noted. Reports has felt terrible this past week. Reports: HA & NV.  Does not have OBGYN. Denies: pain, dizziness. C/o stomach discomfort, fatigue and sob (subjective) at this time. No dyspnea noted.  Visibly upset, tearful.  LMP 03/09/14. [place] [id number] EDD 12/14/2014

## 2014-05-10 NOTE — ED Notes (Signed)
Pt c/o nausea and vomiting for several days.  Also st's tonight started having bright red vag. Bleeding also passing clots.

## 2014-05-10 NOTE — ED Notes (Signed)
Pt st's now she is having abd cramping.

## 2014-05-11 DIAGNOSIS — O039 Complete or unspecified spontaneous abortion without complication: Secondary | ICD-10-CM | POA: Diagnosis not present

## 2014-05-11 LAB — HCG, QUANTITATIVE, PREGNANCY: hCG, Beta Chain, Quant, S: 106510 m[IU]/mL — ABNORMAL HIGH (ref <5)

## 2014-05-11 MED ORDER — ONDANSETRON 4 MG PO TBDP: 4.0000 mg | ORAL_TABLET | Freq: Three times a day (TID) | ORAL | Status: DC | PRN

## 2014-05-11 NOTE — ED Notes (Signed)
Patient transported to Ultrasound 

## 2014-05-11 NOTE — ED Provider Notes (Signed)
Medical screening examination/treatment/procedure(s) were performed by non-physician practitioner and as supervising physician I was immediately available for consultation/collaboration.   EKG Interpretation None       Ayonna Speranza L Corleen Otwell, MD 05/11/14 0940 

## 2014-05-11 NOTE — Discharge Instructions (Signed)
Threatened Miscarriage A threatened miscarriage is when you have vaginal bleeding during your first 20 weeks of pregnancy but the pregnancy has not ended. Your doctor will do tests to make sure you are still pregnant. The cause of the bleeding may not be known. This condition does not mean your pregnancy will end. It does increase the risk of it ending (complete miscarriage). HOME CARE   Make sure you keep all your doctor visits for prenatal care.  Get plenty of rest.  Do not have sex or use tampons if you have vaginal bleeding.  Do not douche.  Do not smoke or use drugs.  Do not drink alcohol.  Avoid caffeine. GET HELP IF:  You have light bleeding from your vagina.  You have belly pain or cramping.  You have a fever. GET HELP RIGHT AWAY IF:   You have heavy bleeding from your vagina.  You have clots of blood coming from your vagina.  You have bad pain or cramps in your low back or belly.  You have fever, chills, and bad belly pain. MAKE SURE YOU:   Understand these instructions.  Will watch your condition.  Will get help right away if you are not doing well or get worse. Document Released: 06/27/2008 Document Revised: 07/20/2013 Document Reviewed: 05/11/2013 [facility] Patient Information 2015 [place]. This information is not intended to replace advice given to you by your health care provider. Make sure you discuss any questions you have with your health care provider.  Pelvic Rest Pelvic rest is sometimes recommended for women when:   The placenta is partially or completely covering the opening of the cervix (placenta previa).  There is bleeding between the uterine wall and the amniotic sac in the first trimester (subchorionic hemorrhage).  The cervix begins to open without labor starting (incompetent cervix, cervical insufficiency).  The labor is too early (preterm labor). HOME CARE INSTRUCTIONS  Do not have sexual intercourse, stimulation, or an  orgasm.  Do not use tampons, douche, or put anything in the vagina.  Do not lift anything over 10 pounds (4.5 kg).  Avoid strenuous activity or straining your pelvic muscles. SEEK MEDICAL CARE IF:  You have any vaginal bleeding during pregnancy. Treat this as a potential emergency.  You have cramping pain felt low in the stomach (stronger than menstrual cramps).  You notice vaginal discharge (watery, mucus, or bloody).  You have a low, dull backache.  There are regular contractions or uterine tightening. SEEK IMMEDIATE MEDICAL CARE IF: You have vaginal bleeding and have placenta previa.  Document Released: 11/09/2010 Document Revised: 10/07/2011 Document Reviewed: 11/09/2010 [facility] Patient Information 2015 [place], [place]. This information is not intended to replace advice given to you by your health care provider. Make sure you discuss any questions you have with your health care provider. Your ultrasound show a viable pregnancy.  [facility] for a repeat HCG--(pregnancy hormone level) If you have any increase in bleeding, or change in your condition [facility]

## 2014-05-11 NOTE — ED Provider Notes (Signed)
23 year old female G1, P0, A0 comes in with an episode of painless vaginal bleeding. Pregnancy diagnosis about 3 weeks ago and she is on prenatal vitamins. On exam, abdomen is soft and nontender. Pelvic exam by Elsie StainGail Shultz NP showed some clots in the vagina and a minimally dilated cervix. Pelvic ultrasound shows IUP 8 weeks 2 days gestation with fetal heart beat identified. She will be referred to [facility] for followup.  Medical screening examination/treatment/procedure(s) were conducted as a shared visit with non-physician practitioner(s) and myself.  I personally evaluated the patient during the encounter.    Dione Boozeavid Jairy Angulo, MD 05/11/14 [phone]

## 2014-05-12 ENCOUNTER — Other Ambulatory Visit: Payer: BC Managed Care – PPO

## 2014-05-12 DIAGNOSIS — O200 Threatened abortion: Secondary | ICD-10-CM

## 2014-05-12 NOTE — Progress Notes (Unsigned)
Patient here today for follow up bcg. Report no change in light brown spotting since going to ER. No cramping at this time. Currently does not have OB/GYN and does not have insurance. Recommended patient to go to health department for care,

## 2014-05-13 ENCOUNTER — Telehealth: Payer: Self-pay

## 2014-05-13 LAB — HCG, QUANTITATIVE, PREGNANCY: HCG, BETA CHAIN, QUANT, S: 150826.8 m[IU]/mL

## 2014-05-13 NOTE — Telephone Encounter (Signed)
Patient called requesting lab results from yesterday. Informed pt. Of appropriate increase in HCG. Also informed her that ultrasound showed positive pregnancy. Advised patient to seek prenatal care. Patient verbalized understanding. No questions or concerns.

## 2014-05-26 ENCOUNTER — Encounter (HOSPITAL_COMMUNITY): Payer: Self-pay | Admitting: *Deleted

## 2014-05-26 ENCOUNTER — Inpatient Hospital Stay (HOSPITAL_COMMUNITY)
Admission: AD | Admit: 2014-05-26 | Discharge: 2014-05-26 | Disposition: A | Discharge status: Home / Self Care | Payer: BC Managed Care – PPO | Source: Ambulatory Visit | Attending: Obstetrics & Gynecology | Admitting: Obstetrics & Gynecology

## 2014-05-26 DIAGNOSIS — G43909 Migraine, unspecified, not intractable, without status migrainosus: Secondary | ICD-10-CM | POA: Insufficient documentation

## 2014-05-26 DIAGNOSIS — R110 Nausea: Secondary | ICD-10-CM | POA: Diagnosis not present

## 2014-05-26 DIAGNOSIS — O219 Vomiting of pregnancy, unspecified: Secondary | ICD-10-CM | POA: Diagnosis present

## 2014-05-26 DIAGNOSIS — R109 Unspecified abdominal pain: Secondary | ICD-10-CM | POA: Insufficient documentation

## 2014-05-26 DIAGNOSIS — K5900 Constipation, unspecified: Secondary | ICD-10-CM | POA: Diagnosis not present

## 2014-05-26 DIAGNOSIS — Z3A11 11 weeks gestation of pregnancy: Secondary | ICD-10-CM | POA: Diagnosis not present

## 2014-05-26 LAB — URINE MICROSCOPIC-ADD ON

## 2014-05-26 LAB — COMPREHENSIVE METABOLIC PANEL
ALBUMIN: 3.5 g/dL (ref 3.5–5.2)
ALT: 7 U/L (ref 0–35)
AST: 14 U/L (ref 0–37)
Alkaline Phosphatase: 45 U/L (ref 39–117)
Anion gap: 11 (ref 5–15)
BILIRUBIN TOTAL: 0.2 mg/dL — AB (ref 0.3–1.2)
BUN: 9 mg/dL (ref 6–23)
CHLORIDE: 98 meq/L (ref 96–112)
CO2: 26 meq/L (ref 19–32)
Calcium: 9 mg/dL (ref 8.4–10.5)
Creatinine, Ser: 0.67 mg/dL (ref 0.50–1.10)
GFR calc Af Amer: >90 mL/min (ref >90)
Glucose, Bld: 97 mg/dL (ref 70–99)
Potassium: 3.8 mEq/L (ref 3.7–5.3)
SODIUM: 135 meq/L — AB (ref 137–147)
Total Protein: 6.9 g/dL (ref 6.0–8.3)

## 2014-05-26 LAB — URINALYSIS, ROUTINE W REFLEX MICROSCOPIC
Bilirubin Urine: NEGATIVE
Glucose, UA: NEGATIVE mg/dL
KETONES UR: NEGATIVE mg/dL
NITRITE: NEGATIVE
PH: 6.5 (ref 5.0–8.0)
Protein, ur: NEGATIVE mg/dL
Specific Gravity, Urine: 1.01 (ref 1.005–1.030)
Urobilinogen, UA: 0.2 mg/dL (ref 0.0–1.0)

## 2014-05-26 LAB — CBC WITH DIFFERENTIAL/PLATELET
BASOS ABS: 0 10*3/uL (ref 0.0–0.1)
Basophils Relative: 0 % (ref 0–1)
Eosinophils Absolute: 0.2 10*3/uL (ref 0.0–0.7)
Eosinophils Relative: 2 % (ref 0–5)
HCT: 36.3 % (ref 36.0–46.0)
Hemoglobin: 13.1 g/dL (ref 12.0–15.0)
LYMPHS PCT: 26 % (ref 12–46)
Lymphs Abs: 1.9 10*3/uL (ref 0.7–4.0)
MCH: 30 pg (ref 26.0–34.0)
MCHC: 36.1 g/dL — ABNORMAL HIGH (ref 30.0–36.0)
MCV: 83.3 fL (ref 78.0–100.0)
Monocytes Absolute: 0.7 10*3/uL (ref 0.1–1.0)
Monocytes Relative: 10 % (ref 3–12)
NEUTROS ABS: 4.4 10*3/uL (ref 1.7–7.7)
NEUTROS PCT: 62 % (ref 43–77)
PLATELETS: 246 10*3/uL (ref 150–400)
RBC: 4.36 MIL/uL (ref 3.87–5.11)
RDW: 12.6 % (ref 11.5–15.5)
WBC: 7.2 10*3/uL (ref 4.0–10.5)

## 2014-05-26 LAB — HIV ANTIBODY (ROUTINE TESTING W REFLEX): HIV 1&2 Ab, 4th Generation: NONREACTIVE

## 2014-05-26 LAB — WET PREP, GENITAL
Clue Cells Wet Prep HPF POC: NONE SEEN
Trich, Wet Prep: NONE SEEN
Yeast Wet Prep HPF POC: NONE SEEN

## 2014-05-26 MED ORDER — POLYETHYLENE GLYCOL 3350 17 GM/SCOOP PO POWD: ORAL | Status: DC

## 2014-05-26 MED ORDER — PROMETHAZINE HCL 25 MG PO TABS
12.5000 mg | ORAL_TABLET | Freq: Once | ORAL | Status: AC
  Administered 2014-05-26: 12.5 mg via ORAL
  Filled 2014-05-26: qty 1

## 2014-05-26 MED ORDER — BUTALBITAL-APAP-CAFFEINE 50-325-40 MG PO TABS: 1.0000 | ORAL_TABLET | Freq: Four times a day (QID) | ORAL | Status: DC | PRN

## 2014-05-26 MED ORDER — PROMETHAZINE HCL 12.5 MG PO TABS: 12.5000 mg | ORAL_TABLET | Freq: Four times a day (QID) | ORAL | Status: DC | PRN

## 2014-05-26 NOTE — Discharge Instructions (Signed)
Eating Plan for Hyperemesis Gravidarum Severe cases of hyperemesis gravidarum can lead to dehydration and malnutrition. The hyperemesis eating plan is one way to lessen the symptoms of nausea and vomiting. It is often used with prescribed medicines to control your symptoms.  WHAT CAN I DO TO RELIEVE MY SYMPTOMS? Listen to your body. Everyone is different and has different preferences. Find what works best for you. Some of the following things may help:  Eat and drink slowly.  Eat 5-6 small meals daily instead of 3 large meals.   Eat crackers before you get out of bed in the morning.   Starchy foods are usually well tolerated (such as cereal, toast, bread, potatoes, pasta, rice, and pretzels).   Ginger may help with nausea. Add  tsp ground ginger to hot tea or choose ginger tea.   Try drinking 100% fruit juice or an electrolyte drink.  Continue to take your prenatal vitamins as directed by your health care provider. If you are having trouble taking your prenatal vitamins, talk with your health care provider about different options.  Include at least 1 serving of protein with your meals and snacks (such as meats or poultry, beans, nuts, eggs, or yogurt). Try eating a protein-rich snack before bed (such as cheese and crackers or a half [place] or peanut butter sandwich). WHAT THINGS SHOULD I AVOID TO REDUCE MY SYMPTOMS? The following things may help reduce your symptoms:  Avoid foods with strong smells. Try eating meals in well-ventilated areas that are free of odors.  Avoid drinking water or other beverages with meals. Try not to drink anything less than 30 minutes before and after meals.  Avoid drinking more than 1 cup of fluid at a time.  Avoid fried or high-fat foods, such as butter and cream sauces.  Avoid spicy foods.  Avoid skipping meals the best you can. Nausea can be more intense on an empty stomach. If you cannot tolerate food at that time, do not force it. Try sucking on  ice chips or other frozen items and make up the calories later.  Avoid lying down within 2 hours after eating. Document Released: 05/12/2007 Document Revised: 07/20/2013 Document Reviewed: 05/19/2013 [facility] Patient Information 2015 [employer] [place]. This information is not intended to replace advice given to you by your health care provider. Make sure you discuss any questions you have with your health care provider. High-Fiber Diet Fiber is found in fruits, vegetables, and grains. A high-fiber diet encourages the addition of more whole grains, legumes, fruits, and vegetables in your diet. The recommended amount of fiber for adult males is 38 g per day. For adult females, it is 25 g per day. Pregnant and lactating women should get 28 g of fiber per day. If you have a digestive or bowel problem, ask your caregiver for advice before adding high-fiber foods to your diet. Eat a variety of high-fiber foods instead of only a select few type of foods.  PURPOSE  To increase stool bulk.  To make bowel movements more regular to prevent constipation.  To lower cholesterol.  To prevent overeating. WHEN IS THIS DIET USED?  It may be used if you have constipation and hemorrhoids.  It may be used if you have uncomplicated diverticulosis (intestine condition) and irritable bowel syndrome.  It may be used if you need help with weight management.  It may be used if you want to add it to your diet as a protective measure against atherosclerosis, diabetes, and cancer. SOURCES OF FIBER  Whole-grain breads and cereals.  Fruits, such as apples, oranges, bananas, berries, prunes, and pears.  Vegetables, such as green peas, carrots, sweet potatoes, beets, broccoli, cabbage, spinach, and artichokes.  Legumes, such split peas, soy, lentils.  Almonds. FIBER CONTENT IN FOODS Starches and Grains / Dietary Fiber (g)  Cheerios, 1 cup / 3 g  Corn Flakes cereal, 1 cup / 0.7 g  Rice crispy treat cereal, 1  cup / 0.3 g  Instant oatmeal (cooked),  cup / 2 g  Frosted wheat cereal, 1 cup / 5.1 g  Brown, long-grain rice (cooked), 1 cup / 3.5 g  White, long-grain rice (cooked), 1 cup / 0.6 g  Enriched macaroni (cooked), 1 cup / 2.5 g Legumes / Dietary Fiber (g)  Baked beans (canned, plain, or vegetarian),  cup / 5.2 g  Kidney beans (canned),  cup / 6.8 g  Pinto beans (cooked),  cup / 5.5 g Breads and Crackers / Dietary Fiber (g)  Plain or honey graham crackers, 2 squares / 0.7 g  Saltine crackers, 3 squares / 0.3 g  Plain, salted pretzels, 10 pieces / 1.8 g  Whole-wheat bread, 1 slice / 1.9 g  White bread, 1 slice / 0.7 g  Raisin bread, 1 slice / 1.2 g  Plain bagel, 3 oz / 2 g  Flour tortilla, 1 oz / 0.9 g  Corn tortilla, 1 small / 1.5 g  Hamburger or hotdog bun, 1 small / 0.9 g Fruits / Dietary Fiber (g)  Apple with skin, 1 medium / 4.4 g  Sweetened applesauce,  cup / 1.5 g  Banana,  medium / 1.5 g  Grapes, 10 grapes / 0.4 g  Orange, 1 small / 2.3 g  Raisin, 1.5 oz / 1.6 g  Melon, 1 cup / 1.4 g Vegetables / Dietary Fiber (g)  Green beans (canned),  cup / 1.3 g  Carrots (cooked),  cup / 2.3 g  Broccoli (cooked),  cup / 2.8 g  Peas (cooked),  cup / 4.4 g  Mashed potatoes,  cup / 1.6 g  Lettuce, 1 cup / 0.5 g  Corn (canned),  cup / 1.6 g  Tomato,  cup / 1.1 g Document Released: 07/15/2005 Document Revised: 01/14/2012 Document Reviewed: 10/17/2011 ExitCare Patient Information 2015 [employer] [place]. This information is not intended to replace advice given to you by your health care provider. Make sure you discuss any questions you have with your health care provider.

## 2014-05-26 NOTE — MAU Provider Note (Signed)
Attestation of Attending Supervision of Advanced Practitioner (CNM/NP): Evaluation and management procedures were performed by the Advanced Practitioner under my supervision and collaboration.  I have reviewed the Advanced Practitioner's note and chart, and I agree with the management and plan.  HARRAWAY-SMITH, Carter Kaman 5:58 PM

## 2014-05-26 NOTE — MAU Note (Signed)
Pt reports she is having constant N/v can't keep anything down. [facility] Had f/u HCG and was told it was ok. Went to her doctor today and no fetal HT heard on doppler. (pt only 11 week).

## 2014-05-26 NOTE — MAU Provider Note (Signed)
History     CSN: [id number]  Arrival date and time: 05/26/14 1202   First Provider Initiated Contact with Patient 05/26/14 1449      Chief Complaint  Patient presents with  . Emesis   HPI Ms. Delman Kittenryn Rodelo is a 23 y.o. G1P0 at 9071w1d who presents to MAU today with complaint of N/V. The patient states that this has been frequent since last September and worse x 2 weeks. She states that she has an average of 3-4 episodes of vomiting daily. She states some days she can tolerate PO. Last episode of emesis was this morning. Patient has been able to eat throughout the day. She also complains of RUQ pain off and on x weeks. She denies vaginal bleeding [facility] that day showed IUP and small SCH. She is having a small amount of white-yellow vaginal discharge. She denies fever, diarrhea or constipation. She states that [facility] on [address] today and they could not hear the FHR with doppler. She has not yet seen an OB, she is waiting for Medicaid.   OB History   Grav Para Term Preterm Abortions TAB SAB Ect Mult Living   1               Past Medical History  Diagnosis Date  . Headache(784.0) HEADACHES AS TEEN NONE IN 3 YEARS    Past Surgical History  Procedure Laterality Date  . No past surgeries    . Ankle reconstruction  10/03/2011    Procedure: RECONSTRUCTION ANKLE;  Surgeon: Toni ArthursJohn Hewitt, MD;  Location: [facility];  Service: Orthopedics;  Laterality: Left;  left ankle modified brostrum lateral ligament reconstruction  . Wisdom tooth extraction    . Root canal      Family History  Problem Relation Age of Onset  . Hypertension Mother   . Thyroid disease Mother   . Hypertension Father   . Thyroid disease Sister     History  Substance Use Topics  . Smoking status: Never Smoker   . Smokeless tobacco: Never Used  . Alcohol Use: No    Allergies: No Known Allergies  Prescriptions prior to admission   Medication Sig Dispense Refill  . acetaminophen (TYLENOL) 500 MG tablet Take 500 mg by mouth every 6 (six) hours as needed for headache.      . ondansetron (ZOFRAN) 4 MG tablet Take 1 tablet (4 mg total) by mouth every 8 (eight) hours as needed for nausea or vomiting.  30 tablet  3  . Prenatal Vit-Fe Fumarate-FA (PRENATAL MULTIVITAMIN) TABS tablet Take 1 tablet by mouth daily at 12 noon.        Review of Systems  Constitutional: Negative for fever and malaise/fatigue.  Gastrointestinal: Positive for nausea, vomiting and abdominal pain. Negative for diarrhea and constipation.  Genitourinary: Negative for dysuria, urgency and frequency.       Neg - vaginal bleeding + vaginal discharge   Physical Exam   Blood pressure 103/68, pulse 109, temperature 99.6 F (37.6 C), resp. rate 18, height 5\' 8"  (1.727 m), weight 152 lb 12.8 oz (69.31 kg), last menstrual period 03/09/2014.  Physical Exam  Constitutional: She is oriented to person, place, and time. She appears well-developed and well-nourished. No distress.  HENT:  Head: Normocephalic.  Cardiovascular: Normal rate.   Respiratory: Effort normal.  GI: Soft. She exhibits no distension and no mass. There is tenderness (mild tenderness to palpation of the RUQ).  There is no rebound and no guarding.  Genitourinary: Uterus is enlarged (appropriate for GA). Uterus is not tender. Cervix exhibits no motion tenderness, no discharge and no friability. Right adnexum displays no mass and no tenderness. Left adnexum displays no mass and no tenderness. No bleeding around the vagina. Vaginal discharge (small amount of thick, white discharge noted) found.  Neurological: She is alert and oriented to person, place, and time.  Skin: Skin is warm and dry. No erythema.  Psychiatric: She has a normal mood and affect.   Results for orders placed during the hospital encounter of 05/26/14 (from the past 24 hour(s))  URINALYSIS, ROUTINE W REFLEX MICROSCOPIC      Status: Abnormal   Collection Time    05/26/14 12:10 PM      Result Value Ref Range   Color, Urine YELLOW  YELLOW   APPearance CLEAR  CLEAR   Specific Gravity, Urine 1.010  1.005 - 1.030   pH 6.5  5.0 - 8.0   Glucose, UA NEGATIVE  NEGATIVE mg/dL   Hgb urine dipstick TRACE (*) NEGATIVE   Bilirubin Urine NEGATIVE  NEGATIVE   Ketones, ur NEGATIVE  NEGATIVE mg/dL   Protein, ur NEGATIVE  NEGATIVE mg/dL   Urobilinogen, UA 0.2  0.0 - 1.0 mg/dL   Nitrite NEGATIVE  NEGATIVE   Leukocytes, UA SMALL (*) NEGATIVE  URINE MICROSCOPIC-ADD ON     Status: Abnormal   Collection Time    05/26/14 12:10 PM      Result Value Ref Range   Squamous Epithelial / LPF MANY (*) RARE   WBC, UA 3-6  <3 WBC/hpf   RBC / HPF 0-2  <3 RBC/hpf   Bacteria, UA MANY (*) RARE  WET PREP, GENITAL     Status: Abnormal   Collection Time    05/26/14  3:05 PM      Result Value Ref Range   Yeast Wet Prep HPF POC NONE SEEN  NONE SEEN   Trich, Wet Prep NONE SEEN  NONE SEEN   Clue Cells Wet Prep HPF POC NONE SEEN  NONE SEEN   WBC, Wet Prep HPF POC MODERATE (*) NONE SEEN  CBC WITH DIFFERENTIAL     Status: Abnormal   Collection Time    05/26/14  3:10 PM      Result Value Ref Range   WBC 7.2  4.0 - 10.5 K/uL   RBC 4.36  3.87 - 5.11 MIL/uL   Hemoglobin 13.1  12.0 - 15.0 g/dL   HCT 16.1  09.6 - 04.5 %   MCV 83.3  78.0 - 100.0 fL   MCH 30.0  26.0 - 34.0 pg   MCHC 36.1 (*) 30.0 - 36.0 g/dL   RDW 40.9  81.1 - 91.4 %   Platelets 246  150 - 400 K/uL   Neutrophils Relative % 62  43 - 77 %   Neutro Abs 4.4  1.7 - 7.7 K/uL   Lymphocytes Relative 26  12 - 46 %   Lymphs Abs 1.9  0.7 - 4.0 K/uL   Monocytes Relative 10  3 - 12 %   Monocytes Absolute 0.7  0.1 - 1.0 K/uL   Eosinophils Relative 2  0 - 5 %   Eosinophils Absolute 0.2  0.0 - 0.7 K/uL   Basophils Relative 0  0 - 1 %   Basophils Absolute 0.0  0.0 - 0.1 K/uL  COMPREHENSIVE METABOLIC PANEL     Status: Abnormal   Collection Time    05/26/14  3:10 PM      Result Value Ref  Range   Sodium 135 (*) 137 - 147 mEq/L   Potassium 3.8  3.7 - 5.3 mEq/L   Chloride 98  96 - 112 mEq/L   CO2 26  19 - 32 mEq/L   Glucose, Bld 97  70 - 99 mg/dL   BUN 9  6 - 23 mg/dL   Creatinine, Ser 5.780.67  0.50 - 1.10 mg/dL   Calcium 9.0  8.4 - 46.910.5 mg/dL   Total Protein 6.9  6.0 - 8.3 g/dL   Albumin 3.5  3.5 - 5.2 g/dL   AST 14  0 - 37 U/L   ALT 7  0 - 35 U/L   Alkaline Phosphatase 45  39 - 117 U/L   Total Bilirubin 0.2 (*) 0.3 - 1.2 mg/dL   GFR calc non Af Amer >90  >90 mL/min   GFR calc Af Amer >90  >90 mL/min   Anion gap 11  5 - 15    MAU Course  Procedures None  MDM FHR - 177 bpm with doppler UA, wet prep, GC/Chlamydia, HIV, CBC, CMP today Urine culture sent 12.5 mg Phenergan PO given in MAU - patient reports improvement in nausea. No vomiting in MAU today.  At time of discharge, patient states recent migraines. Has history of migraines, no headache now. Rx for Fioricet given. Increased PO hydration advised.  Assessment and Plan  A: SIUP at 426w1d Nausea and vomiting in pregnancy prior to [redacted] weeks gestation Abdominal pain in pregnancy Constipation, mild Migraines, chronic  P: Discharge home Rx for Phenergan and Miralax sent to patient's pharmacy. Rx for Fioricet given to patient Discussed diet for N/V of pregnancy Patient advised to start prenatal care with provider of choice ASAP First trimester warning signs discussed Urine culture pending Patient may return to MAU as needed or if her condition were to change or worsen  Marny LowensteinJulie N Wenzel, PA-C  05/26/2014, 3:59 PM

## 2014-05-27 LAB — GC/CHLAMYDIA PROBE AMP
CT PROBE, AMP APTIMA: NEGATIVE
GC PROBE AMP APTIMA: NEGATIVE

## 2014-05-28 LAB — CULTURE, OB URINE
CULTURE: NO GROWTH
Colony Count: NO GROWTH

## 2014-05-30 ENCOUNTER — Encounter (HOSPITAL_COMMUNITY): Payer: Self-pay | Admitting: *Deleted

## 2014-06-15 ENCOUNTER — Other Ambulatory Visit: Payer: Self-pay | Admitting: Obstetrics and Gynecology

## 2014-06-15 LAB — OB RESULTS CONSOLE HEPATITIS B SURFACE ANTIGEN: HEP B S AG: NEGATIVE

## 2014-06-15 LAB — OB RESULTS CONSOLE ABO/RH: RH Type: POSITIVE

## 2014-06-15 LAB — OB RESULTS CONSOLE RPR: RPR: NONREACTIVE

## 2014-06-15 LAB — OB RESULTS CONSOLE RUBELLA ANTIBODY, IGM: Rubella: NON-IMMUNE/NOT IMMUNE

## 2014-06-15 LAB — OB RESULTS CONSOLE ANTIBODY SCREEN: Antibody Screen: NEGATIVE

## 2014-06-15 LAB — OB RESULTS CONSOLE HIV ANTIBODY (ROUTINE TESTING): HIV: NONREACTIVE

## 2014-06-16 LAB — CYTOLOGY - PAP

## 2014-06-28 ENCOUNTER — Ambulatory Visit (INDEPENDENT_AMBULATORY_CARE_PROVIDER_SITE_OTHER): Payer: BC Managed Care – PPO | Admitting: Family Medicine

## 2014-06-28 VITALS — BP 118/60 | HR 57 | Temp 98.1°F | Resp 16 | Ht 68.0 in | Wt 161.8 lb

## 2014-06-28 DIAGNOSIS — Z0000 Encounter for general adult medical examination without abnormal findings: Secondary | ICD-10-CM

## 2014-06-28 NOTE — Progress Notes (Signed)
Tuberculosis Risk Questionnaire  1. No Were you born outside the [place] in one of the following parts of the world: [place], [place], [place], [place] or [place]?    2. No Have you traveled outside the [place] and lived for more than one month in one of the following parts of the world: [place], [place], [place], [place] or [place]?    3. No Do you have a compromised immune system such as from any of the following conditions:HIV/AIDS, organ or bone marrow transplantation, diabetes, immunosuppressive medicines (e.g. Prednisone, Remicaide), leukemia, lymphoma, cancer of the head or neck, gastrectomy or jejunal bypass, end-stage renal disease (on dialysis), or silicosis?     4. No Have you ever or do you plan on working in: a residential care center, a health care facility, a jail or prison or homeless shelter?    5. No Have you ever: injected illegal drugs, used crack cocaine, lived in a homeless shelter  or been in jail or prison?     6. No Have you ever been exposed to anyone with infectious tuberculosis?    Tuberculosis Symptom Questionnaire  Do you currently have any of the following symptoms?  1. No Unexplained cough lasting more than 3 weeks?   2. No Unexplained fever lasting more than 3 weeks.   3. No Night Sweats (sweating that leaves the bedclothes and sheets wet)     4. No Shortness of Breath   5. No Chest Pain   6. No Unintentional weight loss    7. No Unexplained fatigue (very tired for no reason)        Chief Complaint:  Chief Complaint  Patient presents with  . Work PE    HPI: Michelle Ortiz is a 23 y.o. female who is here for  PE with [employer] She is getting her a degree in May, she has the result for prior PPD test which was negative in 06/2013 G1L0 about 15 weeks, does not want blood work, recently done and was normal She declines PPD as well since pregnant   Past Medical History  Diagnosis Date  .  Headache(784.0) HEADACHES AS TEEN NONE IN 3 YEARS   Past Surgical History  Procedure Laterality Date  . No past surgeries    . Ankle reconstruction  10/03/2011    Procedure: RECONSTRUCTION ANKLE;  Surgeon: Toni ArthursJohn Hewitt, MD;  Location: [facility];  Service: Orthopedics;  Laterality: Left;  left ankle modified brostrum lateral ligament reconstruction  . Wisdom tooth extraction    . Root canal     History   Social History  . Marital Status: Single    Spouse Name: N/A    Number of Children: N/A  . Years of Education: N/A   Social History Main Topics  . Smoking status: Never Smoker   . Smokeless tobacco: Never Used  . Alcohol Use: No  . Drug Use: No  . Sexual Activity: Yes    Birth Control/ Protection: Condom   Other Topics Concern  . None   Social History Narrative   Family History  Problem Relation Age of Onset  . Hypertension Mother   . Thyroid disease Mother   . Hypertension Father   . Thyroid disease Sister    No Known Allergies Prior to Admission medications   Medication Sig Start Date End Date Taking? Authorizing Provider  Prenatal Vit-Fe Fumarate-FA (PRENATAL MULTIVITAMIN) TABS tablet Take 1 tablet by mouth daily at 12 noon.   Yes Historical  Provider, MD  acetaminophen (TYLENOL) 500 MG tablet Take 500 mg by mouth every 6 (six) hours as needed for headache.    Historical Provider, MD  butalbital-acetaminophen-caffeine (FIORICET) 50-325-40 MG per tablet Take 1-2 tablets by mouth every 6 (six) hours as needed for headache. Patient not taking: Reported on 06/28/2014 05/26/14 05/26/15  Marny LowensteinJulie N Wenzel, PA-C  ondansetron (ZOFRAN) 4 MG tablet Take 1 tablet (4 mg total) by mouth every 8 (eight) hours as needed for nausea or vomiting. Patient not taking: Reported on 06/28/2014 04/23/14   Tonye Pearsonobert P Doolittle, MD  polyethylene glycol powder [facility](GLYCOLAX/MIRALAX) powder 1 capful daily until normal bowel movement Patient not taking: Reported on 06/28/2014 05/26/14   Marny LowensteinJulie N  Wenzel, PA-C  promethazine (PHENERGAN) 12.5 MG tablet Take 1 tablet (12.5 mg total) by mouth every 6 (six) hours as needed for nausea. 10/03/11 10/10/11  Toni ArthursJohn Hewitt, MD  promethazine (PHENERGAN) 12.5 MG tablet Take 1 tablet (12.5 mg total) by mouth every 6 (six) hours as needed for nausea or vomiting. Patient not taking: Reported on 06/28/2014 05/26/14   Marny LowensteinJulie N Wenzel, PA-C     ROS: The patient denies fevers, chills, night sweats, unintentional weight loss, chest pain, palpitations, wheezing, dyspnea on exertion, nausea, vomiting, abdominal pain, dysuria, hematuria, melena, numbness, weakness, or tingling.   All other systems have been reviewed and were otherwise negative with the exception of those mentioned in the HPI and as above.    PHYSICAL EXAM: Filed Vitals:   06/28/14 1645  BP: 118/60  Pulse: 57  Temp: 98.1 F (36.7 C)  Resp: 16   Filed Vitals:   06/28/14 1645  Height: 5\' 8"  (1.727 m)  Weight: 161 lb 12.8 oz (73.392 kg)   Body mass index is 24.61 kg/(m^2).  General: Alert, no acute distress HEENT:  Normocephalic, atraumatic, oropharynx patent. EOMI, PERRLA Tm nl, no exudates Cardiovascular:  Regular rate and rhythm, no rubs murmurs or gallops.  No Carotid bruits, radial pulse intact. No pedal edema.  Respiratory: Clear to auscultation bilaterally.  No wheezes, rales, or rhonchi.  No cyanosis, no use of accessory musculature GI: No organomegaly, abdomen is soft and non-tender, positive bowel sounds.  No masses. Skin: No rashes. Neurologic: Facial musculature symmetric. Psychiatric: Patient is appropriate throughout our interaction. Lymphatic: No cervical lymphadenopathy Musculoskeletal: Gait intact. 5/5 strength, 2/2 DTRs   LABS: Results for orders placed or performed in visit on 06/15/14  Cytology - PAP  Result Value Ref Range   CYTOLOGY - PAP PAP RESULT      EKG/XRAY:   [facility]   ASSESSMENT/PLAN: Encounter Diagnosis  Name  Primary?  . 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 Patient is aware that all medications have potential sideeffects and we are unable to predict every sideeffect or drug-drug interaction that may occur.  LE, THAO PHUONG, DO 06/28/2014 7:02 PM

## 2014-07-29 NOTE — L&D Delivery Note (Signed)
Patient was C/C/+2 and pushed for 45 minutes with epidural.   NSVD female infant, Apgars 9/9, weight pending.   The patient had 2nd degree laceration repaired with 2-0 vicryl rapide. Fundus was firm. EBL was expected amount. Placenta was delivered intact. Vagina was clear.  Baby was vigorous and doing skin to skin with mother.  Philip AspenALLAHAN, Michelle Ortiz

## 2014-08-14 ENCOUNTER — Inpatient Hospital Stay (HOSPITAL_COMMUNITY)
Admission: AD | Admit: 2014-08-14 | Discharge: 2014-08-14 | Disposition: A | Discharge status: Home / Self Care | Payer: BC Managed Care – PPO | Source: Ambulatory Visit | Attending: Obstetrics and Gynecology | Admitting: Obstetrics and Gynecology

## 2014-08-14 ENCOUNTER — Encounter (HOSPITAL_COMMUNITY): Payer: Self-pay | Admitting: *Deleted

## 2014-08-14 DIAGNOSIS — O26892 Other specified pregnancy related conditions, second trimester: Secondary | ICD-10-CM | POA: Diagnosis not present

## 2014-08-14 DIAGNOSIS — O9989 Other specified diseases and conditions complicating pregnancy, childbirth and the puerperium: Secondary | ICD-10-CM | POA: Diagnosis not present

## 2014-08-14 DIAGNOSIS — R109 Unspecified abdominal pain: Secondary | ICD-10-CM

## 2014-08-14 DIAGNOSIS — O26899 Other specified pregnancy related conditions, unspecified trimester: Secondary | ICD-10-CM

## 2014-08-14 DIAGNOSIS — Z3A22 22 weeks gestation of pregnancy: Secondary | ICD-10-CM | POA: Insufficient documentation

## 2014-08-14 LAB — CBC
HEMATOCRIT: 33.8 % — AB (ref 36.0–46.0)
HEMOGLOBIN: 11.6 g/dL — AB (ref 12.0–15.0)
MCH: 30.2 pg (ref 26.0–34.0)
MCHC: 34.3 g/dL (ref 30.0–36.0)
MCV: 88 fL (ref 78.0–100.0)
Platelets: 193 10*3/uL (ref 150–400)
RBC: 3.84 MIL/uL — ABNORMAL LOW (ref 3.87–5.11)
RDW: 13.3 % (ref 11.5–15.5)
WBC: 9.9 10*3/uL (ref 4.0–10.5)

## 2014-08-14 LAB — URINALYSIS, ROUTINE W REFLEX MICROSCOPIC
BILIRUBIN URINE: NEGATIVE
Glucose, UA: NEGATIVE mg/dL
Hgb urine dipstick: NEGATIVE
Ketones, ur: NEGATIVE mg/dL
Leukocytes, UA: NEGATIVE
Nitrite: NEGATIVE
Protein, ur: NEGATIVE mg/dL
Specific Gravity, Urine: <1.005 — ABNORMAL LOW (ref 1.005–1.030)
Urobilinogen, UA: 0.2 mg/dL (ref 0.0–1.0)
pH: 6.5 (ref 5.0–8.0)

## 2014-08-14 NOTE — Discharge Instructions (Signed)
Take Tylenol 325 mg 2 tablets by mouth every 4 hours if needed for pain. Can call the office and go in for reevaluation if the pain worsens. May want to try a pregnancy support belt to see if additional support will lessen the pain. Continue to drink lots of fluids as you have been doing.

## 2014-08-14 NOTE — MAU Provider Note (Signed)
History     CSN: [id number]  Arrival date and time: 08/14/14 [id number]   First Provider Initiated Contact with Patient 08/14/14 0915      Chief Complaint  Patient presents with  . Abdominal Pain   HPI Michelle Ortiz 23 y.o.  7210w6d  Comes to MAU with pain in the midline below the umbilicus x 3 days.  Tender to any touch.  Hurts worse when urinating.  Thinks it may be UTI or appendicitis.  Denies any vaginal bleeding or vaginal leaking.  Denies any fever, vomiting or diarrhea.  Has experienced round ligament pain but this is midline in her abdomen.  OB History    Gravida Para Term Preterm AB TAB SAB Ectopic Multiple Living   1               Past Medical History  Diagnosis Date  . Headache(784.0) HEADACHES AS TEEN NONE IN 3 YEARS    Past Surgical History  Procedure Laterality Date  . No past surgeries    . Ankle reconstruction  10/03/2011    Procedure: RECONSTRUCTION ANKLE;  Surgeon: Toni ArthursJohn Hewitt, MD;  Location: [facility];  Service: Orthopedics;  Laterality: Left;  left ankle modified brostrum lateral ligament reconstruction  . Wisdom tooth extraction    . Root canal      Family History  Problem Relation Age of Onset  . Hypertension Mother   . Thyroid disease Mother   . Hypertension Father   . Thyroid disease Sister     History  Substance Use Topics  . Smoking status: Never Smoker   . Smokeless tobacco: Never Used  . Alcohol Use: No    Allergies: No Known Allergies  Prescriptions prior to admission  Medication Sig Dispense Refill Last Dose  . ondansetron (ZOFRAN) 4 MG tablet Take 1 tablet (4 mg total) by mouth every 8 (eight) hours as needed for nausea or vomiting. 30 tablet 3 Past Month at Unknown time  . Prenatal Vit-Fe Fumarate-FA (PRENATAL MULTIVITAMIN) TABS tablet Take 1 tablet by mouth daily at 12 noon.   08/13/2014 at Unknown time  . promethazine (PHENERGAN) 12.5 MG tablet Take 1 tablet (12.5 mg total) by mouth every 6 (six) hours as needed for nausea  or vomiting. 30 tablet 0 Past Week at Unknown time  . butalbital-acetaminophen-caffeine (FIORICET) 50-325-40 MG per tablet Take 1-2 tablets by mouth every 6 (six) hours as needed for headache. (Patient not taking: Reported on 06/28/2014) 20 tablet 0 Not Taking  . polyethylene glycol powder (GLYCOLAX/MIRALAX) powder 1 capful daily until normal bowel movement (Patient not taking: Reported on 06/28/2014) 255 g 0 Not Taking  . promethazine (PHENERGAN) 12.5 MG tablet Take 1 tablet (12.5 mg total) by mouth every 6 (six) hours as needed for nausea. (Patient not taking: Reported on 08/14/2014) 30 tablet 0     Review of Systems  Constitutional: Negative for fever.  Gastrointestinal: Positive for abdominal pain. Negative for nausea, vomiting, diarrhea and constipation.  Genitourinary: Negative for dysuria and urgency.       No vaginal discharge. No vaginal bleeding.   Physical Exam   Blood pressure 129/76, pulse 84, temperature 98.2 F (36.8 C), temperature source Oral, resp. rate 17, height 5\' 9"  (1.753 m), weight 169 lb (76.658 kg), last menstrual period 03/09/2014, SpO2 98 %.  Physical Exam  Nursing note and vitals reviewed. Constitutional: She is oriented to person, place, and time. She appears well-developed and well-nourished.  HENT:  Head: Normocephalic.  Eyes: EOM are normal.  Neck: Neck supple.  GI: Soft. She exhibits no distension. There is tenderness. There is no rebound and no guarding.  Tender to palpation in the midline below the umbilicus.  Musculoskeletal: Normal range of motion.  Neurological: She is alert and oriented to person, place, and time.  Skin: Skin is warm and dry.  Psychiatric: She has a normal mood and affect.    MAU Course  Procedures Results for orders placed or performed during the hospital encounter of 08/14/14 (from the past 24 hour(s))  Urinalysis, Routine w reflex microscopic     Status: Abnormal   Collection Time: 08/14/14  8:38 AM  Result Value Ref Range    Color, Urine YELLOW YELLOW   APPearance CLEAR CLEAR   Specific Gravity, Urine <1.005 (L) 1.005 - 1.030   pH 6.5 5.0 - 8.0   Glucose, UA NEGATIVE NEGATIVE mg/dL   Hgb urine dipstick NEGATIVE NEGATIVE   Bilirubin Urine NEGATIVE NEGATIVE   Ketones, ur NEGATIVE NEGATIVE mg/dL   Protein, ur NEGATIVE NEGATIVE mg/dL   Urobilinogen, UA 0.2 0.0 - 1.0 mg/dL   Nitrite NEGATIVE NEGATIVE   Leukocytes, UA NEGATIVE NEGATIVE  CBC     Status: Abnormal   Collection Time: 08/14/14  9:32 AM  Result Value Ref Range   WBC 9.9 4.0 - 10.5 K/uL   RBC 3.84 (L) 3.87 - 5.11 MIL/uL   Hemoglobin 11.6 (L) 12.0 - 15.0 g/dL   HCT 81.1 (L) 91.4 - 78.2 %   MCV 88.0 78.0 - 100.0 fL   MCH 30.2 26.0 - 34.0 pg   MCHC 34.3 30.0 - 36.0 g/dL   RDW 95.6 21.3 - 08.6 %   Platelets 193 150 - 400 K/uL    MDM 5784  Consult with Dr. Henderson Cloud by phone re: plan of care  Assessment and Plan  Abdominal pain in pregnancy - likely stretching of the abdominal musculature  Plan Take Tylenol 325 mg 2 tablets by mouth every 4 hours if needed for pain. Can call the office and go in for reevaluation if the pain worsens. May want to try a pregnancy support belt to see if additional support will lessen the pain. Continue to drink lots of fluids as you have been doing.    BURLESON,TERRI 08/14/2014, 9:26 AM

## 2014-08-14 NOTE — MAU Note (Signed)
Started having a sharp pain below her bellybutton for the last three days, gets worse when she urinates and Tylenol has not helped.  Denies LOF/VB.

## 2014-11-09 ENCOUNTER — Encounter (HOSPITAL_COMMUNITY): Payer: Self-pay

## 2014-11-09 ENCOUNTER — Inpatient Hospital Stay (HOSPITAL_COMMUNITY)
Admission: AD | Admit: 2014-11-09 | Discharge: 2014-11-09 | Disposition: A | Discharge status: Home / Self Care | Payer: BC Managed Care – PPO | Source: Ambulatory Visit | Attending: Obstetrics | Admitting: Obstetrics

## 2014-11-09 DIAGNOSIS — O479 False labor, unspecified: Secondary | ICD-10-CM | POA: Diagnosis not present

## 2014-11-09 DIAGNOSIS — Z3A34 34 weeks gestation of pregnancy: Secondary | ICD-10-CM | POA: Diagnosis not present

## 2014-11-09 DIAGNOSIS — R109 Unspecified abdominal pain: Secondary | ICD-10-CM | POA: Insufficient documentation

## 2014-11-09 DIAGNOSIS — O4703 False labor before 37 completed weeks of gestation, third trimester: Secondary | ICD-10-CM | POA: Insufficient documentation

## 2014-11-09 LAB — URINALYSIS, ROUTINE W REFLEX MICROSCOPIC
Bilirubin Urine: NEGATIVE
Glucose, UA: NEGATIVE mg/dL
Hgb urine dipstick: NEGATIVE
KETONES UR: NEGATIVE mg/dL
LEUKOCYTES UA: NEGATIVE
Nitrite: NEGATIVE
Protein, ur: NEGATIVE mg/dL
Specific Gravity, Urine: 1.01 (ref 1.005–1.030)
UROBILINOGEN UA: 0.2 mg/dL (ref 0.0–1.0)
pH: 5.5 (ref 5.0–8.0)

## 2014-11-09 NOTE — MAU Provider Note (Signed)
History     CSN: [id number]  Arrival date and time: 11/09/14 2211   First Provider Initiated Contact with Patient 11/09/14 2245      No chief complaint on file.  HPI Comments: Michelle Ortiz is a 24 y.o. G1P0 at 4553w2d who presents today with abdominal pain. She states that the pain started around 1530 today. She states that she will feel her abdomen tighten, and then she will have a moment where she can't catch her breath. She denies any VB or LOF. She states that the baby has been moving normally. She states that she "felt weird", and she took her B/P at home, and states that it was 135/92. She denies any blood pressure problems with this pregnancy. She denies any HA, visual disturbances or RUQ pain.   Abdominal Pain This is a new problem. The current episode started today. The onset quality is sudden. The problem has been unchanged. The pain is located in the generalized abdominal region. The pain is at a severity of 4/10. The quality of the pain is cramping (tightening ). The abdominal pain does not radiate. Associated symptoms include nausea. Pertinent negatives include no fever, headaches or vomiting. The pain is aggravated by certain positions.    Past Medical History  Diagnosis Date  . Headache(784.0) HEADACHES AS TEEN NONE IN 3 YEARS    Past Surgical History  Procedure Laterality Date  . Ankle reconstruction  10/03/2011    Procedure: RECONSTRUCTION ANKLE;  Surgeon: Toni ArthursJohn Hewitt, MD;  Location: [facility];  Service: Orthopedics;  Laterality: Left;  left ankle modified brostrum lateral ligament reconstruction  . Wisdom tooth extraction    . Root canal      Family History  Problem Relation Age of Onset  . Hypertension Mother   . Thyroid disease Mother   . Hypertension Father   . Thyroid disease Sister     History  Substance Use Topics  . Smoking status: Never Smoker   . Smokeless tobacco: Never Used  . Alcohol Use: No    Allergies: No Known  Allergies  Prescriptions prior to admission  Medication Sig Dispense Refill Last Dose  . acetaminophen (TYLENOL) 500 MG tablet Take 500 mg by mouth every 6 (six) hours as needed for headache.   Past Month at Unknown time  . Prenatal Vit-Fe Fumarate-FA (PRENATAL MULTIVITAMIN) TABS tablet Take 1 tablet by mouth daily at 12 noon.   11/08/2014 at Unknown time    Review of Systems  Constitutional: Negative for fever.  Eyes: Negative for blurred vision.  Respiratory: Negative for shortness of breath.   Cardiovascular: Negative for chest pain.  Gastrointestinal: Positive for nausea and abdominal pain. Negative for vomiting.  Neurological: Negative for headaches.   Physical Exam   Blood pressure 119/71, pulse 97, temperature 98.3 F (36.8 C), temperature source Oral, resp. rate 16, height 5\' 9"  (1.753 m), weight 87.998 kg (194 lb), last menstrual period 03/09/2014, SpO2 99 %.  Physical Exam  Nursing note and vitals reviewed. Constitutional: She is oriented to person, place, and time. She appears well-developed and well-nourished. No distress.  Cardiovascular: Normal rate.   Respiratory: Effort normal.  GI: Soft. There is no tenderness. There is no rebound.  Genitourinary:  Cervix: closed/thick/ballotable.   Neurological: She is alert and oriented to person, place, and time.  Skin: Skin is warm and dry.  Psychiatric: She has a normal mood and affect.  FHT 125, moderate with 15x15 accels, no decels Toco: some UI when first on the  monitor, but none now. Patient states that cramping sensation has decreased as well.   MAU Course  Procedures  MDM 2315: D/W Dr. Chestine Spore, ok for DC home.    Assessment and Plan   1. Braxton Hicks contractions    DC home Fetal kick counts PTL precautions  Return to MAU as needed  Follow-up Information    Follow up with [facility] Lizabeth Leyden, MD.   Specialty:  Obstetrics   Why:  As scheduled   Contact information:   [address] [place]  [address] [place] [address] [phone]        Tawnya Crook 11/09/2014, 10:48 PM

## 2014-11-09 NOTE — Discharge Instructions (Signed)
Braxton Hicks Contractions °Contractions of the uterus can occur throughout pregnancy. Contractions are not always a sign that you are in labor.  °WHAT ARE BRAXTON HICKS CONTRACTIONS?  °Contractions that occur before labor are called Braxton Hicks contractions, or false labor. Toward the end of pregnancy (32-34 weeks), these contractions can develop more often and may become more forceful. This is not true labor because these contractions do not result in opening (dilatation) and thinning of the cervix. They are sometimes difficult to tell apart from true labor because these contractions can be forceful and people have different pain tolerances. You should not feel embarrassed if you go to the hospital with false labor. Sometimes, the only way to tell if you are in true labor is for your health care provider to look for changes in the cervix. °If there are no prenatal problems or other health problems associated with the pregnancy, it is completely safe to be sent home with false labor and await the onset of true labor. °HOW CAN YOU TELL THE DIFFERENCE BETWEEN TRUE AND FALSE LABOR? °False Labor °· The contractions of false labor are usually shorter and not as hard as those of true labor.   °· The contractions are usually irregular.   °· The contractions are often felt in the front of the lower abdomen and in the groin.   °· The contractions may go away when you walk around or change positions while lying down.   °· The contractions get weaker and are shorter lasting as time goes on.   °· The contractions do not usually become progressively stronger, regular, and closer together as with true labor.   °True Labor °· Contractions in true labor last 30-70 seconds, become very regular, usually become more intense, and increase in frequency.   °· The contractions do not go away with walking.   °· The discomfort is usually felt in the top of the uterus and spreads to the lower abdomen and low back.   °· True labor can be  determined by your health care provider with an exam. This will show that the cervix is dilating and getting thinner.   °WHAT TO REMEMBER °· Keep up with your usual exercises and follow other instructions given by your health care provider.   °· Take medicines as directed by your health care provider.   °· Keep your regular prenatal appointments.   °· Eat and drink lightly if you think you are going into labor.   °· If Braxton Hicks contractions are making you uncomfortable:   °· Change your position from lying down or resting to walking, or from walking to resting.   °· Sit and rest in a tub of warm water.   °· Drink 2-3 glasses of water. Dehydration may cause these contractions.   °· Do slow and deep breathing several times an hour.   °WHEN SHOULD I SEEK IMMEDIATE MEDICAL CARE? °Seek immediate medical care if: °· Your contractions become stronger, more regular, and closer together.   °· You have fluid leaking or gushing from your vagina.   °· You have a fever.   °· You pass blood-tinged mucus.   °· You have vaginal bleeding.   °· You have continuous abdominal pain.   °· You have low back pain that you never had before.   °· You feel your baby's head pushing down and causing pelvic pressure.   °· Your baby is not moving as much as it used to.   °Document Released: 07/15/2005 Document Revised: 07/20/2013 Document Reviewed: 04/26/2013 °ExitCare® Patient Information ©2015 [employer]. This information is not intended to replace advice given to you by your health care   provider. Make sure you discuss any questions you have with your health care provider. °Preterm Labor Information °Preterm labor is when labor starts at less than 37 weeks of pregnancy. The normal length of a pregnancy is 39 to 41 weeks. °CAUSES °Often, there is no identifiable underlying cause as to why a woman goes into preterm labor. One of the most common known causes of preterm labor is infection. Infections of the uterus, cervix, vagina, amniotic  sac, bladder, kidney, or even the lungs (pneumonia) can cause labor to start. Other suspected causes of preterm labor include:  °· Urogenital infections, such as yeast infections and bacterial vaginosis.   °· Uterine abnormalities (uterine shape, uterine septum, fibroids, or bleeding from the placenta).   °· A cervix that has been operated on (it may fail to stay closed).   °· Malformations in the fetus.   °· Multiple gestations (twins, triplets, and so on).   °· Breakage of the amniotic sac.   °RISK FACTORS °· Having a previous history of preterm labor.   °· Having premature rupture of membranes (PROM).   °· Having a placenta that covers the opening of the cervix (placenta previa).   °· Having a placenta that separates from the uterus (placental abruption).   °· Having a cervix that is too weak to hold the fetus in the uterus (incompetent cervix).   °· Having too much fluid in the amniotic sac (polyhydramnios).   °· Taking illegal drugs or smoking while pregnant.   °· Not gaining enough weight while pregnant.   °· Being younger than 18 and older than 23 years old.   °· Having a low socioeconomic status.   °· Being African American. °SYMPTOMS °Signs and symptoms of preterm labor include:  °· Menstrual-like cramps, abdominal pain, or back pain. °· Uterine contractions that are regular, as frequent as six in an hour, regardless of their intensity (may be mild or painful). °· Contractions that start on the top of the uterus and spread down to the lower abdomen and back.   °· A sense of increased pelvic pressure.   °· A watery or bloody mucus discharge that comes from the vagina.   °TREATMENT °Depending on the length of the pregnancy and other circumstances, your health care provider may suggest bed rest. If necessary, there are medicines that can be given to stop contractions and to mature the fetal lungs. If labor happens before 34 weeks of pregnancy, a prolonged hospital stay may be recommended. Treatment depends on  the condition of both you and the fetus.  °WHAT SHOULD YOU DO IF YOU THINK YOU ARE IN PRETERM LABOR? °Call your health care provider right away. You will need to go to the hospital to get checked immediately. °HOW CAN YOU PREVENT PRETERM LABOR IN FUTURE PREGNANCIES? °You should:  °· Stop smoking if you smoke.  °· Maintain healthy weight gain and avoid chemicals and drugs that are not necessary. °· Be watchful for any type of infection. °· Inform your health care provider if you have a known history of preterm labor. °Document Released: 10/05/2003 Document Revised: 03/17/2013 Document Reviewed: 08/17/2012 °ExitCare® Patient Information ©2015 [employer]. This information is not intended to replace advice given to you by your health care provider. Make sure you discuss any questions you have with your health care provider. ° °

## 2014-11-09 NOTE — MAU Note (Signed)
Pt states she is just uncomfortable, has a lot of pressure in her upper abd and it makes it hard to breathe. States her b/p was elevated at home.

## 2014-11-18 LAB — OB RESULTS CONSOLE GBS: GBS: POSITIVE

## 2014-11-18 LAB — OB RESULTS CONSOLE GC/CHLAMYDIA
Chlamydia: NEGATIVE
Gonorrhea: NEGATIVE

## 2014-12-15 ENCOUNTER — Inpatient Hospital Stay (HOSPITAL_COMMUNITY)
Admission: AD | Admit: 2014-12-15 | Discharge: 2014-12-16 | Disposition: A | Discharge status: Home / Self Care | Payer: BC Managed Care – PPO | Source: Ambulatory Visit | Attending: Obstetrics & Gynecology | Admitting: Obstetrics & Gynecology

## 2014-12-15 DIAGNOSIS — O99824 Streptococcus B carrier state complicating childbirth: Secondary | ICD-10-CM | POA: Diagnosis present

## 2014-12-15 DIAGNOSIS — Z3A39 39 weeks gestation of pregnancy: Secondary | ICD-10-CM | POA: Diagnosis present

## 2014-12-16 ENCOUNTER — Inpatient Hospital Stay (HOSPITAL_COMMUNITY)
Admission: AD | Admit: 2014-12-16 | Discharge: 2014-12-18 | DRG: 775 | Disposition: A | Discharge status: Home / Self Care | Payer: BC Managed Care – PPO | Source: Ambulatory Visit | Attending: Obstetrics and Gynecology | Admitting: Obstetrics and Gynecology

## 2014-12-16 ENCOUNTER — Encounter (HOSPITAL_COMMUNITY): Payer: Self-pay | Admitting: *Deleted

## 2014-12-16 ENCOUNTER — Inpatient Hospital Stay (HOSPITAL_COMMUNITY)
Admission: AD | Admit: 2014-12-16 | Discharge: 2014-12-16 | Disposition: A | Discharge status: Home / Self Care | Payer: BC Managed Care – PPO | Source: Ambulatory Visit | Attending: Obstetrics and Gynecology | Admitting: Obstetrics and Gynecology

## 2014-12-16 ENCOUNTER — Inpatient Hospital Stay (HOSPITAL_COMMUNITY): Payer: BC Managed Care – PPO | Admitting: Anesthesiology

## 2014-12-16 DIAGNOSIS — Z3A39 39 weeks gestation of pregnancy: Secondary | ICD-10-CM | POA: Diagnosis present

## 2014-12-16 DIAGNOSIS — O99824 Streptococcus B carrier state complicating childbirth: Secondary | ICD-10-CM | POA: Diagnosis present

## 2014-12-16 DIAGNOSIS — Z3490 Encounter for supervision of normal pregnancy, unspecified, unspecified trimester: Secondary | ICD-10-CM

## 2014-12-16 LAB — TYPE AND SCREEN
ABO/RH(D): B POS
Antibody Screen: NEGATIVE

## 2014-12-16 LAB — CBC
HEMATOCRIT: 33.5 % — AB (ref 36.0–46.0)
Hemoglobin: 11.2 g/dL — ABNORMAL LOW (ref 12.0–15.0)
MCH: 26.4 pg (ref 26.0–34.0)
MCHC: 33.4 g/dL (ref 30.0–36.0)
MCV: 78.8 fL (ref 78.0–100.0)
Platelets: 233 10*3/uL (ref 150–400)
RBC: 4.25 MIL/uL (ref 3.87–5.11)
RDW: 14.5 % (ref 11.5–15.5)
WBC: 12.8 10*3/uL — AB (ref 4.0–10.5)

## 2014-12-16 LAB — ABO/RH: ABO/RH(D): B POS

## 2014-12-16 MED ORDER — FENTANYL 2.5 MCG/ML BUPIVACAINE 1/10 % EPIDURAL INFUSION (WH - ANES)
14.0000 mL/h | INTRAMUSCULAR | Status: DC | PRN
  Administered 2014-12-16 (×2): 14 mL/h via EPIDURAL

## 2014-12-16 MED ORDER — OXYTOCIN 40 UNITS IN LACTATED RINGERS INFUSION - SIMPLE MED
62.5000 mL/h | INTRAVENOUS | Status: DC
  Filled 2014-12-16: qty 1000

## 2014-12-16 MED ORDER — PHENYLEPHRINE 40 MCG/ML (10ML) SYRINGE FOR IV PUSH (FOR BLOOD PRESSURE SUPPORT)
PREFILLED_SYRINGE | INTRAVENOUS | Status: AC
  Filled 2014-12-16: qty 20

## 2014-12-16 MED ORDER — EPHEDRINE 5 MG/ML INJ
10.0000 mg | INTRAVENOUS | Status: AC | PRN
  Administered 2014-12-17 (×2): 10 mg via INTRAVENOUS
  Filled 2014-12-16: qty 4

## 2014-12-16 MED ORDER — PENICILLIN G POTASSIUM 5000000 UNITS IJ SOLR
2.5000 10*6.[IU] | INTRAMUSCULAR | Status: DC
  Administered 2014-12-17: 2.5 10*6.[IU] via INTRAVENOUS
  Filled 2014-12-16 (×4): qty 2.5

## 2014-12-16 MED ORDER — ACETAMINOPHEN 325 MG PO TABS: 650.0000 mg | ORAL_TABLET | ORAL | Status: DC | PRN

## 2014-12-16 MED ORDER — OXYCODONE-ACETAMINOPHEN 5-325 MG PO TABS
1.0000 | ORAL_TABLET | Freq: Once | ORAL | Status: AC
  Administered 2014-12-16: 1 via ORAL
  Filled 2014-12-16: qty 1

## 2014-12-16 MED ORDER — PHENYLEPHRINE 40 MCG/ML (10ML) SYRINGE FOR IV PUSH (FOR BLOOD PRESSURE SUPPORT)
80.0000 ug | PREFILLED_SYRINGE | INTRAVENOUS | Status: DC | PRN
  Administered 2014-12-17 (×2): 80 ug via INTRAVENOUS
  Filled 2014-12-16: qty 2

## 2014-12-16 MED ORDER — LIDOCAINE HCL (PF) 1 % IJ SOLN
30.0000 mL | INTRAMUSCULAR | Status: DC | PRN
  Filled 2014-12-16: qty 30

## 2014-12-16 MED ORDER — OXYTOCIN BOLUS FROM INFUSION
500.0000 mL | INTRAVENOUS | Status: DC
  Administered 2014-12-17: 500 mL via INTRAVENOUS

## 2014-12-16 MED ORDER — OXYCODONE-ACETAMINOPHEN 5-325 MG PO TABS: 2.0000 | ORAL_TABLET | ORAL | Status: DC | PRN

## 2014-12-16 MED ORDER — CITRIC ACID-SODIUM CITRATE 334-500 MG/5ML PO SOLN: 30.0000 mL | ORAL | Status: DC | PRN

## 2014-12-16 MED ORDER — OXYCODONE-ACETAMINOPHEN 5-325 MG PO TABS: 1.0000 | ORAL_TABLET | ORAL | Status: DC | PRN

## 2014-12-16 MED ORDER — FENTANYL 2.5 MCG/ML BUPIVACAINE 1/10 % EPIDURAL INFUSION (WH - ANES)
INTRAMUSCULAR | Status: AC
  Filled 2014-12-16: qty 125

## 2014-12-16 MED ORDER — MORPHINE SULFATE 10 MG/ML IJ SOLN
10.0000 mg | Freq: Once | INTRAMUSCULAR | Status: AC
  Administered 2014-12-16: 10 mg via INTRAMUSCULAR
  Filled 2014-12-16: qty 1

## 2014-12-16 MED ORDER — LACTATED RINGERS IV SOLN
INTRAVENOUS | Status: DC
  Administered 2014-12-16 – 2014-12-17 (×2): via INTRAVENOUS

## 2014-12-16 MED ORDER — ONDANSETRON HCL 4 MG/2ML IJ SOLN
4.0000 mg | Freq: Four times a day (QID) | INTRAMUSCULAR | Status: DC | PRN
  Administered 2014-12-16: 4 mg via INTRAVENOUS
  Filled 2014-12-16: qty 2

## 2014-12-16 MED ORDER — DIPHENHYDRAMINE HCL 50 MG/ML IJ SOLN: 12.5000 mg | INTRAMUSCULAR | Status: DC | PRN

## 2014-12-16 MED ORDER — DEXTROSE 5 % IV SOLN
5.0000 10*6.[IU] | Freq: Once | INTRAVENOUS | Status: AC
  Administered 2014-12-16: 5 10*6.[IU] via INTRAVENOUS
  Filled 2014-12-16: qty 5

## 2014-12-16 MED ORDER — FLEET ENEMA 7-19 GM/118ML RE ENEM: 1.0000 | ENEMA | RECTAL | Status: DC | PRN

## 2014-12-16 MED ORDER — LACTATED RINGERS IV SOLN
500.0000 mL | INTRAVENOUS | Status: DC | PRN
  Administered 2014-12-16 – 2014-12-17 (×2): 500 mL via INTRAVENOUS
  Administered 2014-12-17: 300 mL via INTRAVENOUS

## 2014-12-16 NOTE — H&P (Signed)
24 y.o. 4251w4d  G1P0 comes in c/o ctx.  Otherwise has good fetal movement and no bleeding.  Past Medical History  Diagnosis Date  . Headache(784.0) HEADACHES AS TEEN NONE IN 3 YEARS    Past Surgical History  Procedure Laterality Date  . Ankle reconstruction  10/03/2011    Procedure: RECONSTRUCTION ANKLE;  Surgeon: Toni ArthursJohn Hewitt, MD;  Location: [facility];  Service: Orthopedics;  Laterality: Left;  left ankle modified brostrum lateral ligament reconstruction  . Wisdom tooth extraction    . Root canal      OB History  Gravida Para Term Preterm AB SAB TAB Ectopic Multiple Living  1             # Outcome Date GA Lbr Len/2nd Weight Sex Delivery Anes PTL Lv  1 Current               History   Social History  . Marital Status: Single    Spouse Name: N/A  . Number of Children: N/A  . Years of Education: N/A   Occupational History  . Not on file.   Social History Main Topics  . Smoking status: Never Smoker   . Smokeless tobacco: Never Used  . Alcohol Use: No  . Drug Use: No  . Sexual Activity: Yes    Birth Control/ Protection: None   Other Topics Concern  . Not on file   Social History Narrative   Review of patient's allergies indicates no known allergies.    Prenatal Transfer Tool  Maternal Diabetes: No Genetic Screening: Normal Maternal Ultrasounds/Referrals: Normal Fetal Ultrasounds or other Referrals:  None Maternal Substance Abuse:  No Significant Maternal Medications:  None Significant Maternal Lab Results: Lab values include: Group B Strep positive  Other PNC: rubella, non-immune    There were no vitals filed for this visit.   Lungs/Cor:  NAD Abdomen:  soft, gravid Ex:  no cords, erythema SVE:  6/100/-1 FHTs:  120, good STV, NST R Toco:  q5   A/P   Admi to L&D for labor  GBS pos- pcn  Epidural desired  Other routine care  [place]anoALLAHAN, Luther ParodySIDNEY

## 2014-12-16 NOTE — MAU Note (Signed)
Contractions every 3 minutes. Denies bright red vaginal bleeding.  States Positive bloody show.  Positive fetal movement Denies SROM/LOF  Denies any complications of pregnancy except: GBS positive per patient

## 2014-12-16 NOTE — Discharge Instructions (Signed)

## 2014-12-16 NOTE — MAU Note (Signed)
PT  SAYS SHE STARTED  HURTING  BAD AT  7PM.   VE IN OFFICE  1 CM  ON  WED.     DENIES HSV AND MRSA.  GBS- POSITIVE.

## 2014-12-16 NOTE — MAU Note (Signed)
Report called to Soledad GerlachLeigh Ann in [place]. Ok for pt to come to 172

## 2014-12-16 NOTE — MAU Note (Signed)
Dr. Mora ApplPinn given report and pt may go home or given the option to walk for a couple of hours and then be rechecked.

## 2014-12-17 ENCOUNTER — Encounter (HOSPITAL_COMMUNITY): Payer: Self-pay | Admitting: Obstetrics

## 2014-12-17 LAB — RPR: RPR: NONREACTIVE

## 2014-12-17 MED ORDER — SENNOSIDES-DOCUSATE SODIUM 8.6-50 MG PO TABS
2.0000 | ORAL_TABLET | ORAL | Status: DC
  Administered 2014-12-17: 2 via ORAL
  Filled 2014-12-17: qty 2

## 2014-12-17 MED ORDER — SIMETHICONE 80 MG PO CHEW: 80.0000 mg | CHEWABLE_TABLET | ORAL | Status: DC | PRN

## 2014-12-17 MED ORDER — ZOLPIDEM TARTRATE 5 MG PO TABS: 5.0000 mg | ORAL_TABLET | Freq: Every evening | ORAL | Status: DC | PRN

## 2014-12-17 MED ORDER — LANOLIN HYDROUS EX OINT: TOPICAL_OINTMENT | CUTANEOUS | Status: DC | PRN

## 2014-12-17 MED ORDER — OXYCODONE-ACETAMINOPHEN 5-325 MG PO TABS: 2.0000 | ORAL_TABLET | ORAL | Status: DC | PRN

## 2014-12-17 MED ORDER — BUPIVACAINE HCL (PF) 0.25 % IJ SOLN
INTRAMUSCULAR | Status: DC | PRN
  Administered 2014-12-16 (×2): 4 mL

## 2014-12-17 MED ORDER — PRENATAL MULTIVITAMIN CH
1.0000 | ORAL_TABLET | Freq: Every day | ORAL | Status: DC
  Administered 2014-12-17 – 2014-12-18 (×2): 1 via ORAL
  Filled 2014-12-17 (×2): qty 1

## 2014-12-17 MED ORDER — DIBUCAINE 1 % RE OINT: 1.0000 "application " | TOPICAL_OINTMENT | RECTAL | Status: DC | PRN

## 2014-12-17 MED ORDER — WITCH HAZEL-GLYCERIN EX PADS: 1.0000 "application " | MEDICATED_PAD | CUTANEOUS | Status: DC | PRN

## 2014-12-17 MED ORDER — ONDANSETRON HCL 4 MG PO TABS: 4.0000 mg | ORAL_TABLET | ORAL | Status: DC | PRN

## 2014-12-17 MED ORDER — IBUPROFEN 600 MG PO TABS
600.0000 mg | ORAL_TABLET | Freq: Four times a day (QID) | ORAL | Status: DC
  Administered 2014-12-17 – 2014-12-18 (×5): 600 mg via ORAL
  Filled 2014-12-17 (×5): qty 1

## 2014-12-17 MED ORDER — TETANUS-DIPHTH-ACELL PERTUSSIS 5-2.5-18.5 LF-MCG/0.5 IM SUSP: 0.5000 mL | Freq: Once | INTRAMUSCULAR | Status: DC

## 2014-12-17 MED ORDER — ONDANSETRON HCL 4 MG/2ML IJ SOLN: 4.0000 mg | INTRAMUSCULAR | Status: DC | PRN

## 2014-12-17 MED ORDER — DIPHENHYDRAMINE HCL 25 MG PO CAPS: 25.0000 mg | ORAL_CAPSULE | Freq: Four times a day (QID) | ORAL | Status: DC | PRN

## 2014-12-17 MED ORDER — LIDOCAINE-EPINEPHRINE (PF) 2 %-1:200000 IJ SOLN
INTRAMUSCULAR | Status: DC | PRN
  Administered 2014-12-16: 3 mL

## 2014-12-17 MED ORDER — BENZOCAINE-MENTHOL 20-0.5 % EX AERO
1.0000 "application " | INHALATION_SPRAY | CUTANEOUS | Status: DC | PRN
  Administered 2014-12-17: 1 via TOPICAL
  Filled 2014-12-17: qty 56

## 2014-12-17 MED ORDER — ACETAMINOPHEN 325 MG PO TABS: 650.0000 mg | ORAL_TABLET | ORAL | Status: DC | PRN

## 2014-12-17 MED ORDER — OXYCODONE-ACETAMINOPHEN 5-325 MG PO TABS
1.0000 | ORAL_TABLET | ORAL | Status: DC | PRN
Start: 2014-12-17 — End: 2014-12-18

## 2014-12-17 NOTE — Anesthesia Preprocedure Evaluation (Signed)
Anesthesia Evaluation  Patient identified by MRN, date of birth, ID band Patient awake    Reviewed: Allergy & Precautions, NPO status , Patient's Chart, lab work & pertinent test results  History of Anesthesia Complications Negative for: history of anesthetic complications  Airway Mallampati: III  TM Distance: >3 FB Neck ROM: Full    Dental  (+) Teeth Intact   Pulmonary neg pulmonary ROS,  breath sounds clear to auscultation        Cardiovascular negative cardio ROS  Rhythm:Regular     Neuro/Psych  Headaches, negative psych ROS   GI/Hepatic negative GI ROS, Neg liver ROS,   Endo/Other  negative endocrine ROS  Renal/GU negative Renal ROS     Musculoskeletal   Abdominal   Peds  Hematology  (+) anemia ,   Anesthesia Other Findings   Reproductive/Obstetrics (+) Pregnancy                             Anesthesia Physical Anesthesia Plan  ASA: II  Anesthesia Plan: Epidural   Post-op Pain Management:    Induction:   Airway Management Planned:   Additional Equipment:   Intra-op Plan:   Post-operative Plan:   Informed Consent: I have reviewed the patients History and Physical, chart, labs and discussed the procedure including the risks, benefits and alternatives for the proposed anesthesia with the patient or authorized representative who has indicated his/her understanding and acceptance.     Plan Discussed with: Anesthesiologist  Anesthesia Plan Comments:         Anesthesia Quick Evaluation

## 2014-12-17 NOTE — Progress Notes (Signed)
Patient sleeping, mother states pt doing well, bleeding has been appropriate, pt without complaints, just tired.  Filed Vitals:   12/17/14 0547 12/17/14 0603 12/17/14 0630 12/17/14 0730  BP: 122/83 116/75 123/77 117/70  Pulse: 103 91 75 91  Temp:    98.6 F (37 C)  TempSrc:   Oral Oral  Resp: 18 18 19 18   Height:      Weight:      SpO2:         Lab Results  Component Value Date   WBC 12.8* 12/16/2014   HGB 11.2* 12/16/2014   HCT 33.5* 12/16/2014   MCV 78.8 12/16/2014   PLT 233 12/16/2014    --/--/B POS, B POS (05/20 2238)  A/P Post partum day 0. Did not wake pt.  Routine care.    Philip AspenALLAHAN, Yasmene Salomone

## 2014-12-17 NOTE — Plan of Care (Signed)
Problem: Consults Goal: Skin Care Protocol Initiated - if Braden Score 18 or less If consults are not indicated, leave blank or document N/A  Outcome: Not Applicable Date Met:  45/80/99 Braden scale 21.

## 2014-12-17 NOTE — Lactation Note (Signed)
This note was copied from the chart of Michelle Ortiz. Lactation Consultation Note  Baby 10 hours old.  BFx4, 1 void, 1 stool. P1. Upon entering mother had just finished breastfeeding in cross cradle position on L side. Baby cueing.  Reviewed waking techniques and suggest parents unwrap baby for feedings. Taught mother hand expression. Assisted mother in placing baby in football hold on R side.  Baby latched easily.  Sucks and swallows observed.  LS8. Mother's nipples tender.  Reviewed how to keep baby close to achieve a deep latch, wait for baby to open wide and demonstrated chin tug. Provided mother with comfort gels and reviewed applying ebm. Discussed cluster feeding and basics.  Answered lots of questions. Mom encouraged to feed baby 8-12 times/24 hours and with feeding cues.  Mom made aware of O/P services, breastfeeding support groups, community resources, and our phone # for post-discharge questions.     Patient Name: Michelle Ortiz KGMWN'UToday's Date: 12/17/2014 Reason for consult: Initial assessment   Maternal Data Has patient been taught Hand Expression?: Yes Does the patient have breastfeeding experience prior to this delivery?: No  Feeding Feeding Type: Breast Fed Length of feed: 15 min  LATCH Score/Interventions Latch: Grasps breast easily, tongue down, lips flanged, rhythmical sucking.  Audible Swallowing: Spontaneous and intermittent Intervention(s): Hand expression;Skin to skin;Alternate breast massage  Type of Nipple: Everted at rest and after stimulation  Comfort (Breast/Nipple): Filling, red/small blisters or bruises, mild/mod discomfort  Problem noted: Mild/Moderate discomfort Interventions (Mild/moderate discomfort): Hand expression;Comfort gels  Hold (Positioning): Assistance needed to correctly position infant at breast and maintain latch.  LATCH Score: 8  Lactation Tools Discussed/Used     Consult Status Consult Status: Follow-up Date:  12/18/14 Follow-up type: In-patient    Dahlia ByesBerkelhammer, [facility] 12/17/2014, 3:47 PM

## 2014-12-17 NOTE — Anesthesia Procedure Notes (Signed)
Epidural Patient location during procedure: OB  Staffing Anesthesiologist: Jed Kutch, CHRIS Performed by: anesthesiologist   Preanesthetic Checklist Completed: patient identified, surgical consent, pre-op evaluation, timeout performed, IV checked, risks and benefits discussed and monitors and equipment checked  Epidural Patient position: sitting Prep: site prepped and draped and DuraPrep Patient monitoring: heart rate, cardiac monitor, continuous pulse ox and blood pressure Approach: midline Location: L3-L4 Injection technique: LOR saline  Needle:  Needle type: Tuohy  Needle gauge: 17 G Needle length: 9 cm Needle insertion depth: 7 cm Catheter type: closed end flexible Catheter size: 19 Gauge Catheter at skin depth: 13 cm Test dose: negative and 2% lidocaine with Epi 1:200 K  Assessment Events: blood not aspirated, injection not painful, no injection resistance, negative IV test and no paresthesia  Additional Notes H+P and labs checked, risks and benefits discussed with the patient, consent obtained, procedure tolerated well and without complications.  Reason for block:procedure for pain   

## 2014-12-17 NOTE — Anesthesia Postprocedure Evaluation (Signed)
Anesthesia Post Note  Patient: Michelle Ortiz  Procedure(s) Performed: * No procedures listed *  Anesthesia type: Epidural  Patient location: Mother/Baby  Post pain: Pain level controlled  Post assessment: Post-op Vital signs reviewed  Last Vitals:  Filed Vitals:   12/17/14 1130  BP: 123/69  Pulse: 86  Temp: 37 C  Resp: 20    Post vital signs: Reviewed  Level of consciousness:alert  Complications: No apparent anesthesia complications

## 2014-12-18 DIAGNOSIS — Z3490 Encounter for supervision of normal pregnancy, unspecified, unspecified trimester: Secondary | ICD-10-CM

## 2014-12-18 LAB — CBC
HCT: 30.3 % — ABNORMAL LOW (ref 36.0–46.0)
HEMOGLOBIN: 9.8 g/dL — AB (ref 12.0–15.0)
MCH: 26.1 pg (ref 26.0–34.0)
MCHC: 32.3 g/dL (ref 30.0–36.0)
MCV: 80.8 fL (ref 78.0–100.0)
Platelets: 184 10*3/uL (ref 150–400)
RBC: 3.75 MIL/uL — ABNORMAL LOW (ref 3.87–5.11)
RDW: 14.7 % (ref 11.5–15.5)
WBC: 10.8 10*3/uL — ABNORMAL HIGH (ref 4.0–10.5)

## 2014-12-18 MED ORDER — MEASLES, MUMPS & RUBELLA VAC ~~LOC~~ INJ
0.5000 mL | INJECTION | SUBCUTANEOUS | Status: AC
  Administered 2014-12-18: 0.5 mL via SUBCUTANEOUS
  Filled 2014-12-18: qty 0.5

## 2014-12-18 NOTE — Progress Notes (Signed)
Patient is eating, ambulating, voiding.  Pain control is good.  Breastfeeding going well.  Appropriate lochia, no complaints.  Filed Vitals:   12/17/14 1130 12/17/14 1845 12/17/14 1930 12/18/14 0625  BP: 123/69 121/67 116/65 116/79  Pulse: 86 98 77 78  Temp: 98.6 F (37 C) 98.5 F (36.9 C) 98.9 F (37.2 C) 98.3 F (36.8 C)  TempSrc: Oral Axillary Oral Oral  Resp: 20 18 18 18   Height:      Weight:      SpO2:   100% 100%    Fundus firm Perineum without swelling. No CT  Lab Results  Component Value Date   WBC 10.8* 12/18/2014   HGB 9.8* 12/18/2014   HCT 30.3* 12/18/2014   MCV 80.8 12/18/2014   PLT 184 12/18/2014    --/--/B POS, B POS (05/20 2238)  A/P Post partum day 1.  Routine care.  Expect d/c 5/23.    Philip AspenALLAHAN, Jahleah Mariscal

## 2014-12-18 NOTE — Discharge Summary (Signed)
Obstetric Discharge Summary Reason for Admission: onset of labor Prenatal Procedures: ultrasound Intrapartum Procedures: spontaneous vaginal delivery Postpartum Procedures: none Complications-Operative and Postpartum: 2nd degree perineal laceration HEMOGLOBIN  Date Value Ref Range Status  12/18/2014 9.8* 12.0 - 15.0 g/dL Final   HCT  Date Value Ref Range Status  12/18/2014 30.3* 36.0 - 46.0 % Final    Physical Exam:  General: alert and cooperative Lochia: appropriate Uterine Fundus: firm DVT Evaluation: No evidence of DVT seen on physical exam.  Discharge Diagnoses: Term Pregnancy-delivered  Discharge Information: Date: 12/18/2014 Activity: pelvic rest Diet: routine Medications: PNV and Ibuprofen Condition: stable Instructions: refer to practice specific booklet Discharge to: home Follow-up Information    Follow up with Michelle Tencza, DO In 4 weeks.   Specialty:  Obstetrics and Gynecology   Contact information:   [address] [place] [id number] [phone]       Newborn Data: Live born female  Birth Weight: 7 lb 0.5 oz (3189 g) APGAR: 9, 9  Home with mother.  Michelle Ortiz, Michelle Ortiz 12/18/2014, 2:15 PM

## 2014-12-18 NOTE — Lactation Note (Signed)
This note was copied from the chart of Michelle Almedia Klann. Lactation Consultation Note  Mother states baby has been sleepy at the breast. Reviewed hand expression.   Baby latched in cross cradle hold.  Sucks and some swallows observed with breast massage for 25 min. Encouraged mother to breastfeed baby STS.   Reviewed engorgement care and monitoring voids/stools. Mother asked if she should give formula.  Explained supply and demand. Told mother to monitor voids/stools and if she is concerned to feed the baby. Recommend she post pump 2-3 times a day with her DEBP to boost her milk supply. Give baby back volume pumped. Reviewed how to use her own DEBP.   Patient Name: Michelle Ortiz WGNFA'OToday's Date: 12/18/2014 Reason for consult: Follow-up assessment   Maternal Data    Feeding Feeding Type: Breast Fed Length of feed: 25 min  LATCH Score/Interventions Latch: Grasps breast easily, tongue down, lips flanged, rhythmical sucking.  Audible Swallowing: A few with stimulation Intervention(s): Skin to skin;Hand expression;Alternate breast massage  Type of Nipple: Everted at rest and after stimulation  Comfort (Breast/Nipple): Filling, red/small blisters or bruises, mild/mod discomfort  Problem noted: Mild/Moderate discomfort Interventions (Mild/moderate discomfort): Hand expression;Post-pump  Hold (Positioning): No assistance needed to correctly position infant at breast.  LATCH Score: 8  Lactation Tools Discussed/Used     Consult Status Consult Status: Complete    Hardie PulleyBerkelhammer, Kent Braunschweig Boschen 12/18/2014, 3:45 PM

## 2014-12-26 ENCOUNTER — Ambulatory Visit (HOSPITAL_COMMUNITY)
Admission: RE | Admit: 2014-12-26 | Discharge: 2014-12-26 | Disposition: A | Discharge status: Home / Self Care | Payer: BC Managed Care – PPO | Source: Ambulatory Visit | Attending: Obstetrics and Gynecology | Admitting: Obstetrics and Gynecology

## 2014-12-26 NOTE — Lactation Note (Signed)
Lactation Consult  Mother's reason for visit:  Cracked nipples bilateral, unable to latch onto right breast due to nipple pain Visit Type:  Outpatient Appointment Notes:  Mom called today reporting she has cracked, bleeding nipple on the right breast. She stopped BF on the right breast last night due to pain. The left breast nipple is cracked as well but she is able to BF on the left. [name] is now 839 days old.  Consult:  Initial Lactation Consultant:  Michelle Ortiz, Michelle Ortiz  ________________________________________________________________________    Baby's Name: Michelle Ortiz Date of Birth: 12/17/2013 Pediatrician: Dr. Lucretia Ortiz - [facility] Gender: female Gestational Age: 5641w5d (At Birth) Birth Weight: 7 lb 0.5 oz (3189 g) Weight at Discharge: Weight: 6 lb 13.2 oz (3095 g)Date [facility]   12/17/14 0451 12/17/14 2345  Weight: 7 lb 0.5 oz (3189 g) 6 lb 13.2 oz (3095 g)   [facility]: Tuesday, 12/20/14  6 lb 13.5 oz Location:Pediatrician's office Weight today: 7 lb. 8.3 oz/3412 gm    ________________________________________________________________________  Mother's Name: Michelle Ortiz Type of delivery:  SVB Breastfeeding Experience:  P1 Maternal Medical Conditions:  None Reported Maternal Medications:  PNV  ________________________________________________________________________  Breastfeeding History (Post Discharge)  Frequency of breastfeeding:  On demand, approx 12 times/day Duration of feeding:  5-20 minutes  Patient does not supplement or pump.  Infant Intake and Output Assessment  Voids:  6 in 24 hrs.  Color:  Clear yellow Stools:  4 in 24 hrs.  Color:  Yellow  ________________________________________________________________________  Maternal Breast Assessment  Breast:  Full left breast, right breast becoming engorged with nodules in the outer  quadrants Nipple:  Cracked, scabs, bruised bilateral nipples. Positional stripes present bilateral. Pain level:  8-9 with nursing Pain interventions: Lanolin, coconut oil   _______________________________________________________________________ Feeding Assessment/Evaluation  Initial feeding assessment:  Infant's oral assessment:  Variance. Upper lip labial frenulum thick, short. Possible posterior frenulum, baby can extend her tongue. Chewing/biting noted by upper gum with suck exam and Mom reports this sensation with baby at the breast. Baby has sucking callous' along upper/lower lip.  Positioning:  Cross cradle Left breast  LATCH documentation:  Latch:  2 = Grasps breast easily, tongue down, lips flanged, rhythmical sucking.  Audible swallowing:  2 = Spontaneous and intermittent  Type of nipple:  2 = Everted at rest and after stimulation  Comfort (Breast/Nipple):  0 = Engorged, cracked, bleeding, large blisters, severe discomfort  Hold (Positioning):  1 = Assistance needed to correctly position infant at breast and maintain latch  LATCH score:  7  Attached assessment:  Deep  Lips flanged:  Yes.    Lips untucked:  Yes.    Suck assessment:  Nutritive  Tools:  Nipple shield 20 mm Instructed on use and cleaning of tool:  Yes.    Pre-feed weight:  3412 g  (7 lb. 8.3 oz.) Post-feed weight:  3432 g (7 lb. 9.1 oz.) Amount transferred:  20 ml. Initially latched baby to left breast without nipple shield. PS=9 then >to 5. After 5 minutes Mom was more uncomfortable reporting pain increasing. Initiated #20 nipple shield and this reduced the PS to 1-2 with nursing.   Additional Feeding Assessment -   Infant's oral assessment:  Variance  Positioning:  Cross cradle Right breast  LATCH documentation:  Latch:  2 = Grasps breast easily, tongue down, lips flanged, rhythmical sucking.  Audible swallowing:  2 = Spontaneous and intermittent  Type of nipple:  2 = Everted at rest and after  stimulation  Comfort (Breast/Nipple):  0 = Engorged, cracked, bleeding, large blisters, severe discomfort  Hold (Positioning):  1 = Assistance needed to correctly position infant at breast and maintain latch  LATCH score:  7  Attached assessment:  Deep, using #20 nipple shield  Lips flanged:  Yes.    Lips untucked:  Yes.    Suck assessment:  Displays both  Tools:  Nipple shield 20 mm. Also tried #24 but baby transferred milk better with #20 nipple shield Instructed on use and cleaning of tool:  Yes.    Pre-feed weight:  3432 g  (7 lb. 9.1 oz.) Post-feed weight:  3482 g (7 lb. 10.8 oz.) Amount transferred:  50 ml. Mom could not latch without using nipple shield on right breast. Started with #20 nipple shield and baby nursed for 8 minutes transferring 24 ml. Changed to size 24 to see if this was better fit. Baby nursed for 10 minutes and transferred 4 ml. Changed back to #20 nipple shield and with nursing an additional 15 minutes baby transferred another 22 ml. For a total transfer of 50 ml off the right breast.    Total amount pumped post feed:  R 20 ml  Using Harmony Hand pump. Did not pump left breast  Total amount transferred:  70 ml Total supplement given:  0 ml  Mom is able to tolerate baby at the breast using the #20 nipple shield. PS was between 1-3. No bleeding observed when using the nipple shield and with massage engorgement was relieved from right breast. Advised Mom to continue to BF on demand but at least 8-12 times in 24 hours. Use #20 nipple shield to latch. Massage breast during feeding and be sure breasts are softening. Use hand pump to post pump as needed to prevent clogged milk ducts. Engorgement care reviewed if needed.  S/S of infection discussed and advised to notify OB if develop. Advised to call [facility] for [facility]. Did give Comfort gels to use with EBM for comfort. Changed flange on hand pump to size 27. Advised if nipples continue to have breakdown, may need to have  Peds evaluate upper lip labial frenulum for referral. OP f/u scheduled with Lactation for Tuesday, 01/03/15 at 0900.

## 2015-01-03 ENCOUNTER — Ambulatory Visit (HOSPITAL_COMMUNITY)
Admission: RE | Admit: 2015-01-03 | Discharge: 2015-01-03 | Disposition: A | Discharge status: Home / Self Care | Payer: BC Managed Care – PPO | Source: Ambulatory Visit | Attending: Obstetrics and Gynecology | Admitting: Obstetrics and Gynecology

## 2015-01-03 NOTE — Lactation Note (Addendum)
Lactation Consult  Mother's reason for visit:  F/u from OP visit on 12/26/14 Visit Type:  Outpatient - feeding assessment Appointment Notes:  Mom here for f/u from visit 12/26/14 where she came for help with latch due to cracked nipples. A nipple shield was initiated at the visit on 12/26/14 and this has helped Mom's nipples to heal and be able to breastfeed. Visit today to reassess nipple trauma and to assess milk transfer with using the nipple shield. Baby's weight at last visit was 7 lb. 8.3 oz/3412 gm. After initial weight today baby has gained 1.9 oz in 7 days. Baby is now 602 weeks old. Mom stopped pumping her breast last Thursday. Mom reports baby is sleepy at the breast and she is currently BF on 1 breast per feeding for 10-15 minutes. She is not giving the baby any supplements.  Consult:  Follow-Up Lactation Consultant:  Michelle Ortiz, Michelle Ortiz  ________________________________________________________________________   Michelle FloresBaby's Name: Michelle SalviaMila Isabella Ortiz Date of Birth: 12/17/2013 Pediatrician: Dr. Lucretia [facility]ersWood, [facility] Gender: female Gestational Age: 1348w5d (At Birth) Birth Weight: 7 lb 0.5 oz (3189 g) Weight at Discharge: Weight: 6 lb 13.2 oz (3095 g)Date of Dis[facility]: [facility]   12/17/14 0451 12/17/14 2345  Weight: 7 lb 0.5 oz (3189 g) 6 lb 13.2 oz (3095 g)   [facility]: 12/26/14 7 lb. 8.3 oz Location: Lactation OP visit.  Weight today:7 lb. 10.2 oz/3464 oz, a weight increase of 1.9 oz in 7 days.      ________________________________________________________________________  Mother's Name: Michelle KittenAryn Akter Type of delivery:   Breastfeeding Experience: P1 Maternal Medical Conditions:  None reported Maternal Medications:  PNV  ________________________________________________________________________  Breastfeeding History (Post Discharge)  Frequency of breastfeeding:  10 times/day Duration of feeding:   10-15 minutes  Patient does not supplement or pump.  Infant Intake and Output Assessment  Voids:  6 in 24 hrs.  Color:  Clear yellow Stools:  6 in 24 hrs.  Color:  Yellow  ________________________________________________________________________  Maternal Breast Assessment  Breast:  Soft Nipple:  Erect, trauma resolved from previous visit 12/26/14 Pain level:  1  PS 1 with initial latch on left breast, PS=5 with initial latch on right breast improving to 0 with baby nursing Pain interventions:  Nipple shield  _______________________________________________________________________ Feeding Assessment/Evaluation  Initial feeding assessment:  Infant's oral assessment:  Variance, Baby has short upper lip labial frenulum and possible short, posterior lingual frenulum, chewing motions with suck exam and Mom reports feeling some chewing at breast.  Positioning:  Cross cradle Left breast  LATCH documentation:  Latch:  2 = Grasps breast easily, tongue down, lips flanged, rhythmical sucking.  Using #20 nipple shield  Audible swallowing:  2 = Spontaneous and intermittent  Type of nipple:  2 = Everted at rest and after stimulation  Comfort (Breast/Nipple):  2 = Soft / non-tender  Hold (Positioning):  2 = No assistance needed to correctly position infant at breast  LATCH score:  10  Attached assessment:  Deep  Lips flanged:  Yes.    Lips untucked:  Yes.    Suck assessment:  Displays both  Tools:  Nipple shield 20 mm Instructed on use and cleaning of tool:  Yes.    Pre-feed weight: 3464 g  (7 lb. 10.2 oz.) Post-feed weight:  3486 g (7 lb. 11.0 oz.) Amount transferred:  22 ml  With nursing on left breast for 20 minutes with #20 nipple shield. PS=1>0.  Additional Feeding Assessment -  Infant's oral assessment:  Variance  Positioning:  Cross cradle Right breast  LATCH documentation:  Latch:  2 = Grasps breast easily, tongue down, lips flanged, rhythmical sucking.  Using #20  nipple shield  Audible swallowing:  2 = Spontaneous and intermittent  Type of nipple:  2 = Everted at rest and after stimulation  Comfort (Breast/Nipple):  2 = Soft / non-tender  Hold (Positioning):  2 = No assistance needed to correctly position infant at breast  LATCH score:  10  Attached assessment:  Deep  Lips flanged:  Yes.    Lips untucked:  Yes.    Suck assessment:  Displays both  Tools:  Nipple shield 20 mm Instructed on use and cleaning of tool:  Yes.    Pre-feed weight:  3486 g  (7 lb. 11.0 oz.) Post-feed weight:  3508 g (7 lb. 11.7 oz.) Amount transferred:  22 ml  With nursing on right breast for 15 minutes using #20 nipple shield to latch. PS=2>0 Amount supplemented:  45 ml   Total amount pumped post feed:  R 25 ml    L 36 ml  Total amount transferred:  44 ml Total supplement given:  45 ml  EBM  Mom is very pleased her nipples have healed since last visit using the nipple shield. After the visit on 12/26/14 Mom was pumping to relieve engorgement, she has not pumped or supplemented the baby since last Thursday. Mom using a Harmony Hand pump at home. Baby's weight gain since last visit has not been adequate at 1.9 oz in 7 days. Baby currently BF on 1 breast each feeding for 10-15 minutes and as evident at this visit with transfer from 1 breast at 22 ml, baby is not transferring adequately at the breast in spite of what appears to be good latch.  LC concerned that frenulum could be affecting milk transfer due to intermittently displaying chewing at the breast resulting in some nutritive/non-nutritive suckling pattern. At this time Mom not interested in referral for evaluation. Plan discussed with Mom: BF at least 8-12 times in 24 hours, both breasts each feeding keeping baby nursing for 15-20 minutes each breast. Use #20 nipple shield to latch baby, be sure to observe breast milk in the nipple shield with feedings. Post pump for 15 minutes after each feeding, except at night  to have EBM to supplement and protect milk supply. Mom plans to get her own DEBP to use.  When possible - consider pre-pumping for 3-5 minutes to remove lower fat milk, this will help baby get higher fat content milk at breast. Supplement each feeding 40-45 ml of EBM on average.  OP f/u scheduled for Monday 01/16/15 at 1:00 pm.

## 2015-01-16 ENCOUNTER — Ambulatory Visit (HOSPITAL_COMMUNITY): Admission: RE | Admit: 2015-01-16 | Payer: BC Managed Care – PPO | Source: Ambulatory Visit

## 2016-06-24 IMAGING — US US OB COMP LESS 14 WK
1 series · 13 of 28 positions shown · non-contrast
Comparison: None.

CLINICAL DATA: Pregnant.  Vaginal bleeding and cramping.

EXAM:
OBSTETRIC <14 WK US AND TRANSVAGINAL OB US
TECHNIQUE: Both transabdominal and transvaginal ultrasound examinations were
performed for complete evaluation of the gestation as well as the
maternal uterus, adnexal regions, and pelvic cul-de-sac.
Transvaginal technique was performed to assess early pregnancy.

[Series 1: us ob comp less 14 wk · 0.20mm/px · 59 acquisitions, 13 frames shown]
[im 3/59]
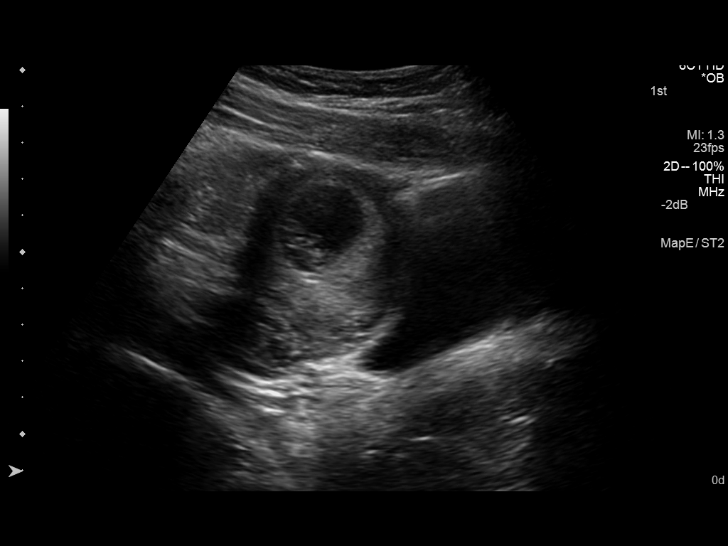
[im 7/59]
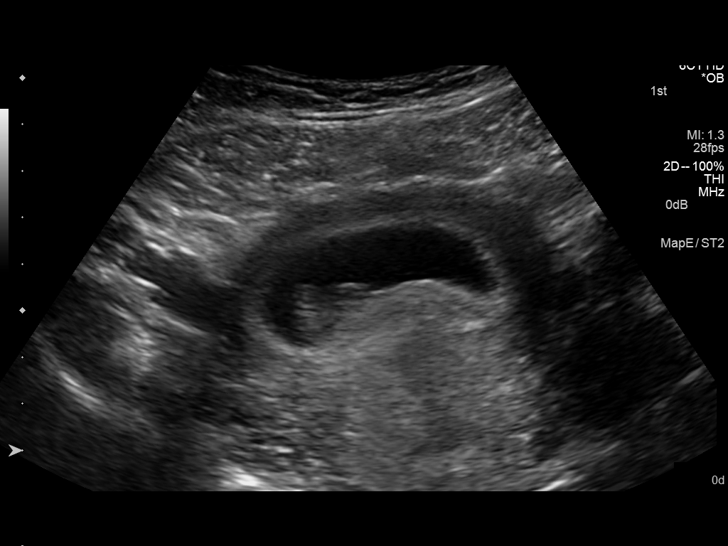
[im 11/59]
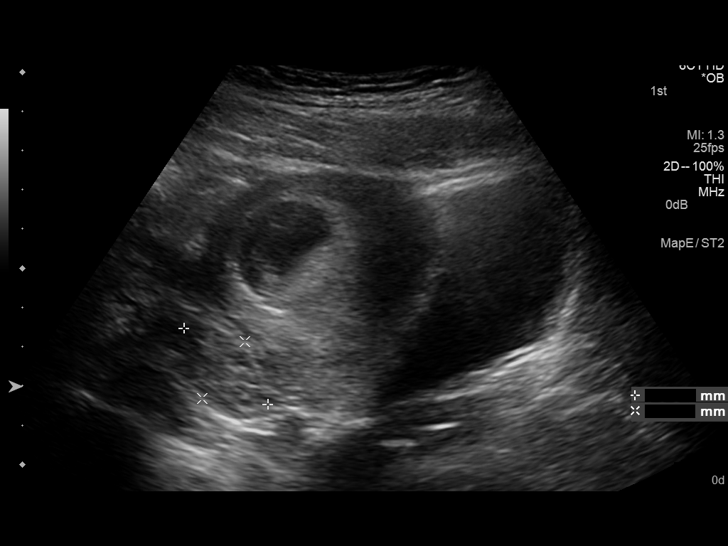
[im 16/59]
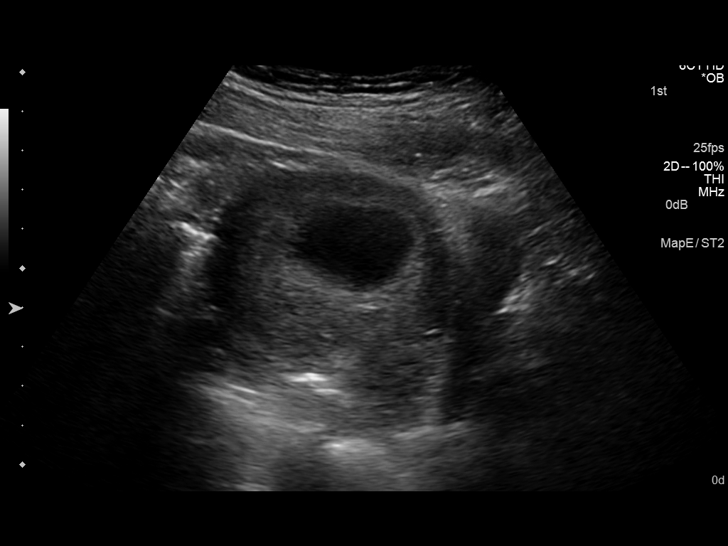
[im 20/59]
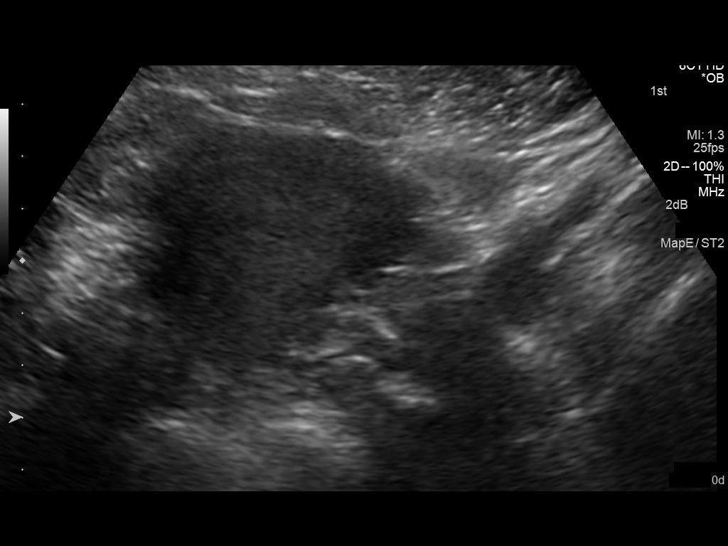
[im 24/59]
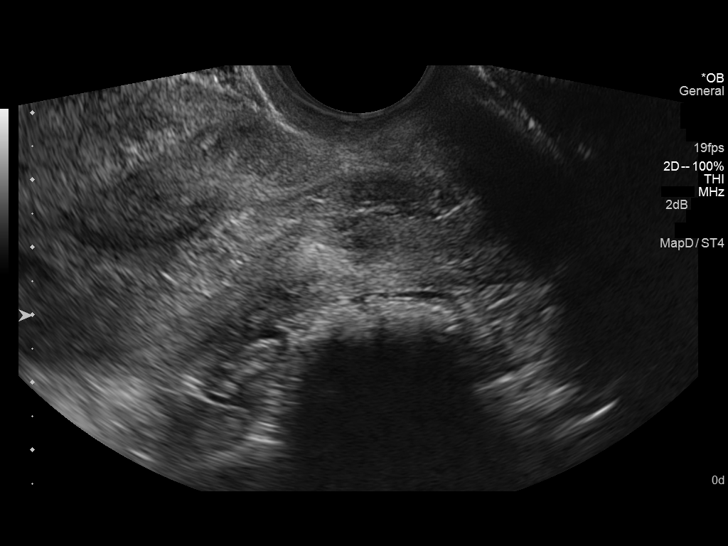
[im 31/59]
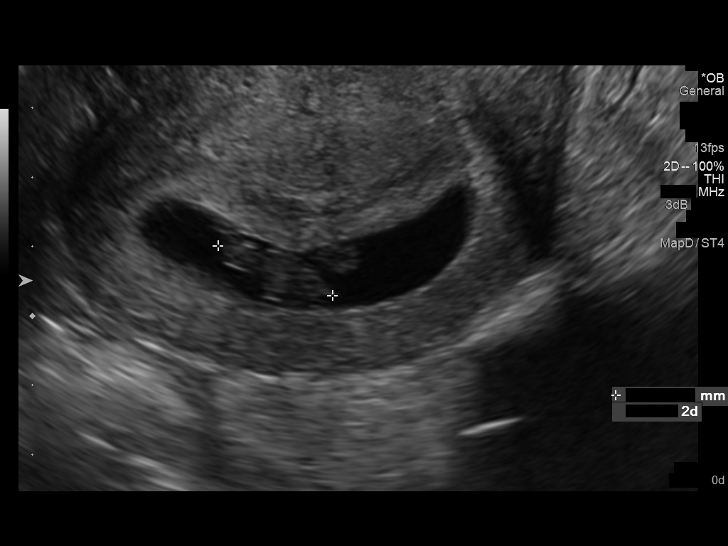
[im 35/59]
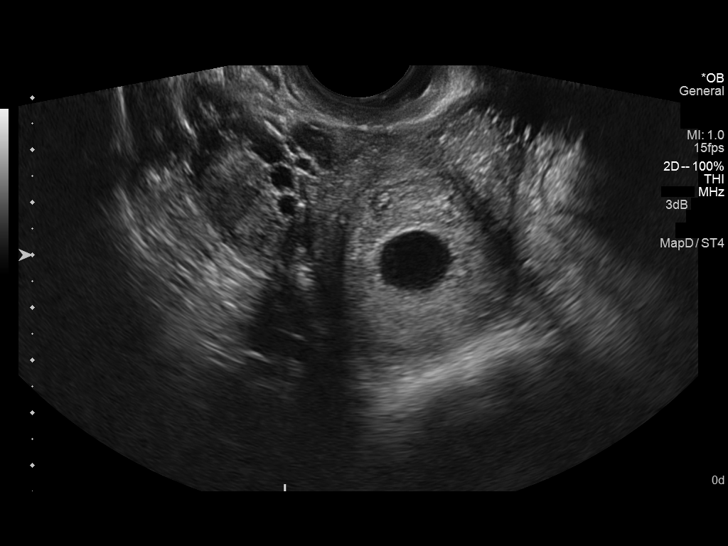
[im 39/59]
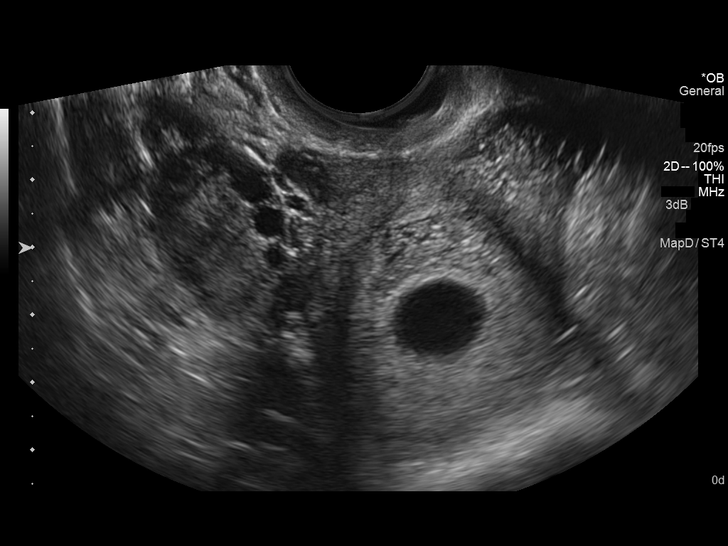
[im 43/59]
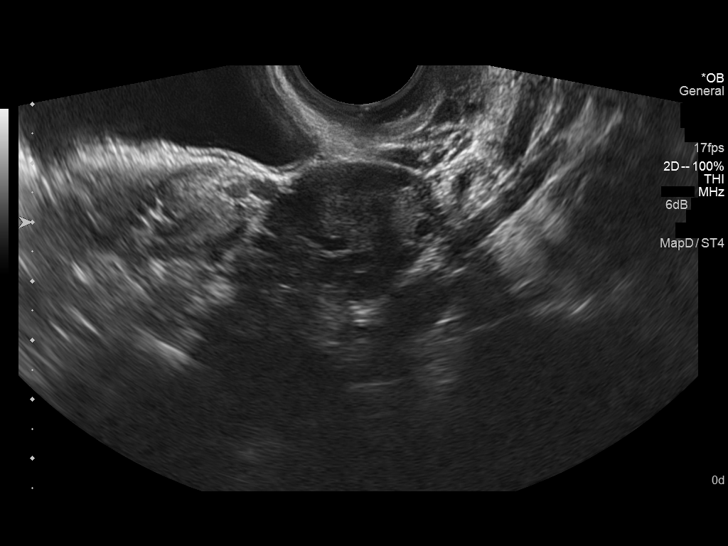
[im 48/59]
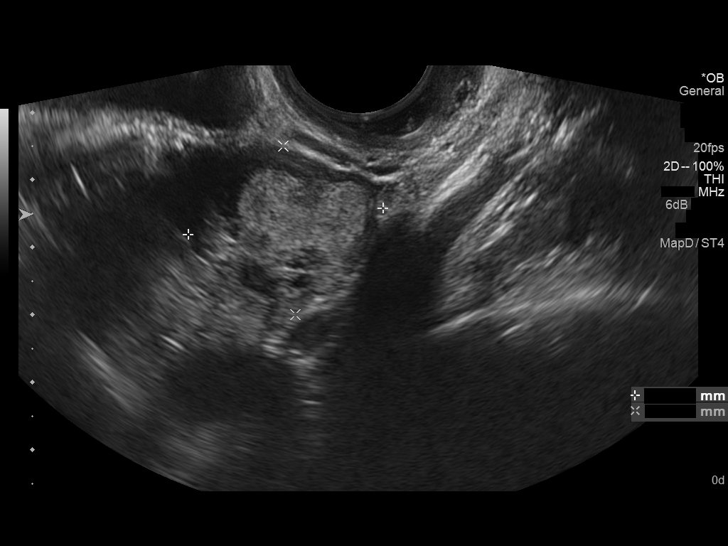
[im 52/59]
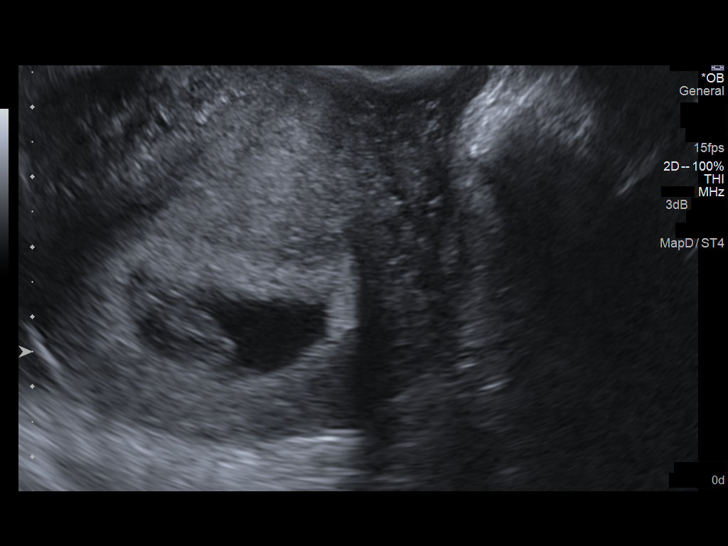
[im 56/59]
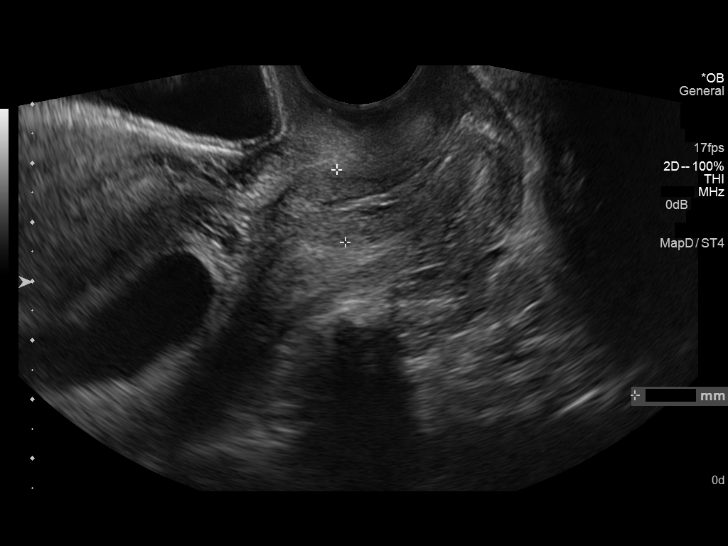

[13 of 28 positions shown; findings below may reference images not displayed]

FINDINGS: Intrauterine gestational sac: A single normal appearing intrauterine
gestational sac is identified. Note is made of a small subchorionic
hemorrhage (image 58)

Yolk sac:  Visualized (image 36)

Embryo:  Visualized

Cardiac Activity: Visualized

Heart Rate:  163 bpm

CRL:   18.1  mm   8 w 2d                  US EDC: 12/19/2014

Maternal uterus/adnexae:

Normal appearance of the anteverted uterus. The cervix remains
closed though there is a minimal amount of echogenic blood within
the cervix (image 58)

The right ovary is normal in size and appearance measuring
approximately 3.6 x 2.3 x 2.3 cm. Multiple anechoic sub cm follicles
are noted within the peripheral aspect of the right ovary.

The left ovary is normal in size and appearance measuring 3.2 x
x 2.9 cm. Note is made of an approximately 1.9 x 1.0 x 1.3 cm
hyperechoic hemorrhagic cyst.
IMPRESSION: 1. Single viable intrauterine gestation with crown-rump length
compatible with 8 weeks, 2 days gestation and an estimated delivery
date of 12/19/2014.
[DATE]. Small amount of subchorionic hemorrhage with minimal amount of
blood within the closed cervix.

## 2016-07-08 ENCOUNTER — Ambulatory Visit (INDEPENDENT_AMBULATORY_CARE_PROVIDER_SITE_OTHER): Payer: BC Managed Care – PPO | Admitting: Family Medicine

## 2016-07-08 VITALS — BP 110/68 | HR 64 | Temp 98.7°F | Resp 17 | Ht 68.0 in | Wt 191.0 lb

## 2016-07-08 DIAGNOSIS — J0110 Acute frontal sinusitis, unspecified: Secondary | ICD-10-CM

## 2016-07-08 DIAGNOSIS — G4489 Other headache syndrome: Secondary | ICD-10-CM

## 2016-07-08 MED ORDER — FLUCONAZOLE 150 MG PO TABS: 150.0000 mg | ORAL_TABLET | Freq: Once | ORAL | 0 refills | Status: AC

## 2016-07-08 MED ORDER — TIZANIDINE HCL 6 MG PO CAPS: 6.0000 mg | ORAL_CAPSULE | Freq: Two times a day (BID) | ORAL | 0 refills | Status: DC | PRN

## 2016-07-08 MED ORDER — TOPIRAMATE 50 MG PO TABS: 50.0000 mg | ORAL_TABLET | Freq: Two times a day (BID) | ORAL | 1 refills | Status: DC

## 2016-07-08 MED ORDER — AMOXICILLIN-POT CLAVULANATE 875-125 MG PO TABS: 1.0000 | ORAL_TABLET | Freq: Two times a day (BID) | ORAL | 0 refills | Status: DC

## 2016-07-08 NOTE — Progress Notes (Signed)
Patient ID: Michelle Ortiz, female    DOB: Sep 04, 1990, 25 y.o.   MRN: [id number]  PCP: No PCP Per Patient  Chief Complaint  Patient presents with  . Migraine    Onset chronic/ worse in last 3 months/ wants referral    Subjective:   HPI Patient [facility] me [facility]. Headaches 25 year old presents for evaluation and referral for migraine headaches. Reports that she was originally dx at age 25 years. The headaches have intermittently persisted although she reports over the last 3 months her headaches have increased in frequency and symptoms. Reports auras present such as visual disturbances before and during headache. She typically has to stop activities and sleep in complete silence and darkness to improve headache pain. Denies dizziness or slurring of speech when headaches occur. Doesn't recall prior prescription for migraine abortive or prophylactic treatment  Sinus Pressure Currently feels congestion may be related to headache.  Headache mostly on the top of head. Congestion around the maxillary region  Cough mostly at night and upon awakening in morning  She has been taking Mucinex, Sudafed, Ibuprofen and tylenol without relief.  Social History   Social History  . Marital status: Single    Spouse name: N/A  . Number of children: N/A  . Years of education: N/A   Occupational History  . Not on file.   Social History Main Topics  . Smoking status: Never Smoker  . Smokeless tobacco: Never Used  . Alcohol use No  . Drug use: No  . Sexual activity: Yes    Birth control/ protection: None   Other Topics Concern  . Not on file   Social History Narrative  . No narrative on file    Family History  Problem Relation Age of Onset  . Hypertension Mother   . Thyroid disease Mother   . Hypertension Father   . Thyroid disease Sister     Review of Systems See HPI  Patient Active Problem List   Diagnosis Date Noted  . Pregnancy 12/18/2014      Prior to Admission medications   Not on File     No Known Allergies     Objective:  Physical Exam  Constitutional: She is oriented to person, place, and time. She appears well-developed and well-nourished.  HENT:  Head: Normocephalic and atraumatic.  Right Ear: Hearing, tympanic membrane, external ear and ear canal normal.  Left Ear: Hearing, tympanic membrane, external ear and ear canal normal.  Nose: Mucosal edema present. Right sinus exhibits maxillary sinus tenderness. Left sinus exhibits maxillary sinus tenderness.  Mouth/Throat: Uvula is midline and mucous membranes are normal. Posterior oropharyngeal erythema present. No oropharyngeal exudate or posterior oropharyngeal edema.  Cardiovascular: Normal rate, regular rhythm, normal heart sounds and intact distal pulses.   Pulmonary/Chest: Effort normal and breath sounds normal.  Musculoskeletal: Normal range of motion.  Neurological: She is alert and oriented to person, place, and time. No cranial nerve deficit. Coordination normal.  Skin: Skin is warm and dry.  Psychiatric: She has a normal mood and affect. Her behavior is normal. Judgment and thought content normal.   Today's Vitals   07/08/16 1510  BP: 110/68  Pulse: 64  Resp: 17  Temp: 98.7 F (37.1 C)  TempSrc: Oral  SpO2: 99%  Weight: 191 lb (86.6 kg)  Height: 5\' 8"  (1.727 m)     Assessment & Plan:  1. Acute frontal sinusitis, recurrence not specified -Start Augmentin 1 tablet twice daily with  food to avoid stomach upset. Complete all medication.  2. Other headache syndrome - AMB referral to headache clinic -Start Topamax start 25 mg (1/2 tablet) at bedtime x 7 days.  Increase to 50 mg twice per day starting on day 8. If you decide to become pregnant, please contact healthcare provider to taper you off medication. -For acute headache, take Zanaflex 6 mg up to 2 times daily as needed.  Godfrey PickKimberly S. Tiburcio PeaHarris, MSN, FNP-C [facility]

## 2016-07-08 NOTE — Patient Instructions (Addendum)
Topamax start 25 mg (1/2 tablet) at bedtime x 7 days. Increase to 50 mg twice per day starting on day 8. If you decide to become pregnant, please contact healthcare provider to taper you off medication.  For acute headache, take Zanaflex 6 mg up to 2 times daily as needed.  Augmentin 1 tablet twice daily for sinus infection x 10 days.  Take over the counter antihistamine Allegra or Zyrtec nightly for congestion. Take Diflucan if vaginal irritation develops from antibiotic treatment. Referred you to the headache clinic.  IF you received an x-ray today, you will receive an invoice from [facility]. Please contact [facility] at [phone] with questions or concerns regarding your invoice.   IF you received labwork today, you will receive an invoice from [facility]. Please contact [facility] at [phone] with questions or concerns regarding your invoice.   Our billing staff will not be able to assist you with questions regarding bills from these companies.  You will be contacted with the lab results as soon as they are available. The fastest way to get your results is to activate your My Chart account. Instructions are located on the last page of this paperwork. If you have not heard from us regarding the results in 2 weeks, please contact this office.

## 2016-09-23 ENCOUNTER — Ambulatory Visit (INDEPENDENT_AMBULATORY_CARE_PROVIDER_SITE_OTHER): Payer: BC Managed Care – PPO | Admitting: Neurology

## 2016-09-23 ENCOUNTER — Encounter: Payer: Self-pay | Admitting: Neurology

## 2016-09-23 VITALS — BP 100/70 | HR 83 | Ht 68.0 in | Wt 191.5 lb

## 2016-09-23 DIAGNOSIS — G44221 Chronic tension-type headache, intractable: Secondary | ICD-10-CM | POA: Diagnosis not present

## 2016-09-23 DIAGNOSIS — G43119 Migraine with aura, intractable, without status migrainosus: Secondary | ICD-10-CM | POA: Diagnosis not present

## 2016-09-23 MED ORDER — PROPRANOLOL HCL 20 MG PO TABS: 20.0000 mg | ORAL_TABLET | Freq: Two times a day (BID) | ORAL | 5 refills | Status: DC

## 2016-09-23 MED ORDER — NAPROXEN 500 MG PO TABS: 500.0000 mg | ORAL_TABLET | Freq: Two times a day (BID) | ORAL | 0 refills | Status: DC | PRN

## 2016-09-23 MED ORDER — PROCHLORPERAZINE MALEATE 5 MG PO TABS: 5.0000 mg | ORAL_TABLET | Freq: Four times a day (QID) | ORAL | 0 refills | Status: DC | PRN

## 2016-09-23 NOTE — Progress Notes (Signed)
[facility] Clinic Note - Initial Visit   Date: 09/23/16  Michelle Ortiz MRN: [id number] DOB: 1991-05-29   Dear Dr. Tiburcio Pea:  Thank you for your kind referral of Michelle Ortiz for consultation of headaches. Although her history is well known to you, please allow [place] to reiterate it for the purpose of our medical record. The patient was accompanied to the clinic by self.   History of Present Illness: Michelle Ortiz is a 26 y.o. right-handed Caucasian female presenting for evaluation of migraines.    Migraines started around the age of 25 and she recalls missing a lot of school for it.  She had MRI brain which was normal and eventually migraines decreased in frequency, occurring about about twice per year.  She was doing well with respect with her migraines until she became pregnant with her first child and then started having them twice per week (02/2014 - 11/2014).  Post-partum, headache frequency improved, but more recently, she had a sinus infection and again started having migraines twice per week. Fortunately, once her infection cleared, her migraines also stopped.    Her typical migraines have not changed in character since onset.  She gets clammy, develops palpitations, and blurry vision/vision loss and about 20-minutes later, she develops a migraine. As a child, she used to have loss of peripheral vision, but has she has grown older, she now has complete blurry vision of both eyes, only able to make out colors and form which will last the duration of her migraine.  She is unable to do anything while her vision is impaired and often tries to leave work, so she can rest at home. No associated dysarthria, lightheadedness, weakness, or vertigo. Headache is throbbing over the vertex of the head which lasts about 3-4 hours. She endorses photophobia, phonophobia, nasuea and vomiting.  Now, she usually gets migraines about once per month.  She was prescribed topiramate, but she was  worried about side effect of acute angle glaucoma so did not pick the medication up.    She also complain of tension headaches for the past 7 years.  Headaches are dull and she is able to function through this.  She has these headaches daily, lasting 2-3 hours.  She takes tylenol sometimes, but she prefers not to take medications.    She does not have morning headaches, headaches that wake her up from sleeping, or worsening headaches with coughing, sneezing, laughing, or bearing down.   Out-side paper records, electronic medical record, and images have been reviewed where available and summarized as:  Lab Results  Component Value Date   WBC 10.8 (H) 12/18/2014   HGB 9.8 (L) 12/18/2014   HCT 30.3 (L) 12/18/2014   MCV 80.8 12/18/2014   PLT 184 12/18/2014   Lab Results  Component Value Date   NA 135 (L) 05/26/2014   K 3.8 05/26/2014   CL 98 05/26/2014   CO2 26 05/26/2014    Past Medical History:  Diagnosis Date  . Headache(784.0) HEADACHES AS TEEN NONE IN 3 YEARS    Past Surgical History:  Procedure Laterality Date  . ANKLE RECONSTRUCTION  10/03/2011   Procedure: RECONSTRUCTION ANKLE;  Surgeon: Toni Arthurs, MD;  Location: [facility];  Service: Orthopedics;  Laterality: Left;  left ankle modified brostrum lateral ligament reconstruction  . ROOT CANAL    . WISDOM TOOTH EXTRACTION       Medications:  Outpatient Encounter Prescriptions as of 09/23/2016  Medication Sig  . phentermine 37.5 MG  capsule Take 37.5 mg by mouth every morning.  Marland Kitchen. NEXPLANON 68 MG IMPL implant   . tizanidine (ZANAFLEX) 6 MG capsule Take 1 capsule (6 mg total) by mouth 2 (two) times daily as needed for muscle spasms. (Patient not taking: Reported on 09/23/2016)  . topiramate (TOPAMAX) 50 MG tablet Take 1 tablet (50 mg total) by mouth 2 (two) times daily. (Patient not taking: Reported on 09/23/2016)  . [DISCONTINUED] amoxicillin-clavulanate (AUGMENTIN) 875-125 MG tablet Take 1 tablet by mouth 2  (two) times daily.   No facility-administered encounter medications on file as of 09/23/2016.      Allergies: No Known Allergies  Family History: Family History  Problem Relation Age of Onset  . Hypertension Mother   . Thyroid disease Mother   . Hypertension Father   . Thyroid disease Sister     Social History: Social History  Substance Use Topics  . Smoking status: Never Smoker  . Smokeless tobacco: Never Used  . Alcohol use No   Social History   Social History Narrative   Lives with fiancee and daughter.  Teaches first grade.  Education: college.    Review of Systems:  CONSTITUTIONAL: No fevers, chills, night sweats, or weight loss.   EYES: No visual changes or eye pain ENT: No hearing changes.  No history of nose bleeds.   RESPIRATORY: No cough, wheezing and shortness of breath.   CARDIOVASCULAR: Negative for chest pain, and palpitations.   GI: Negative for abdominal discomfort, blood in stools or black stools.  No recent change in bowel habits.   GU:  No history of incontinence.   MUSCLOSKELETAL: No history of joint pain or swelling.  No myalgias.   SKIN: Negative for lesions, rash, and itching.   HEMATOLOGY/ONCOLOGY: Negative for prolonged bleeding, bruising easily, and swollen nodes.  No history of cancer.   ENDOCRINE: Negative for cold or heat intolerance, polydipsia or goiter.   PSYCH:  No depression or anxiety symptoms.   NEURO: As Above.   Vital Signs:  BP 100/70   Pulse 83   Ht 5\' 8"  (1.727 m)   Wt 191 lb 8 oz (86.9 kg)   SpO2 95%   BMI 29.12 kg/m    General Medical Exam:   General:  Well appearing, comfortable.   Eyes/ENT: see cranial nerve examination.   Neck: No masses appreciated.  Full range of motion without tenderness.  No carotid bruits. Respiratory:  Clear to auscultation, good air entry bilaterally.   Cardiac:  Regular rate and rhythm, no murmur.   Extremities:  No deformities, edema, or skin discoloration.  Skin:  No rashes or  lesions.  Neurological Exam: MENTAL STATUS including orientation to time, place, person, recent and remote memory, attention span and concentration, language, and fund of knowledge is normal.  Speech is not dysarthric.  CRANIAL NERVES: II:  No visual field defects.  Unremarkable fundi.   III-IV-VI: Pupils equal round and reactive to light.  Normal conjugate, extra-ocular eye movements in all directions of gaze.  No nystagmus.  No ptosis.   V:  Normal facial sensation.     VII:  Normal facial symmetry and movements.  No pathologic facial reflexes.  VIII:  Normal hearing and vestibular function.   IX-X:  Normal palatal movement.   XI:  Normal shoulder shrug and head rotation.   XII:  Normal tongue strength and range of motion, no deviation or fasciculation.  MOTOR:  Motor strength is 5/5 throughout.  No atrophy, fasciculations or abnormal movements.  No  pronator drift.  Tone is normal.    MSRs:  Right                                                                 Left brachioradialis 2+  brachioradialis 2+  biceps 2+  biceps 2+  triceps 2+  triceps 2+  patellar 2+  patellar 2+  ankle jerk 2+  ankle jerk 2+  Hoffman no  Hoffman no  plantar response down  plantar response down   SENSORY:  Normal and symmetric perception of light touch, vibration, and proprioception.    COORDINATION/GAIT: Normal finger-to- nose-finger.  Intact rapid alternating movements bilaterally.  Gait narrow based and stable.   IMPRESSION: 1.  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  Chronic tension headache  PLAN/RECOMMENDATIONS:  1.  For prevention, start propranolol 20mg  twice daily.  Titrate further as tolerable.  Common side effects discussed. We discussed medication options of topiramate or alternative medication and because she has plans for pregnancy in the next 2 years, it was decided to use a beta-blocker.  Certainly, if she does not respond to propranolol, topiramate can be tried as long as she  is not actively trying to conceive 2.  For acute migraine, take naproxen 500mg  at headache onset in combination compazine 5mg   (nausea).   She can also take tizanidine, if she is able to sleep/rest 3.  Try over the counter magnesium oxide 400-600mg  daily 4.  Limit all rescue medication (tylenol, ibuprofen, naproxen) to twice per week 5.  Consider triptans, going forward  Return to clinic in 4 months  The duration of this appointment visit was 45 minutes of face-to-face time with the patient.  Greater than 50% of this time was spent in counseling, explanation of diagnosis, planning of further management, and coordination of care.   Thank you for allowing me to participate in patient's care.  If I can answer any additional questions, I would be pleased to do so.    Sincerely,    Donika K. Allena Katz, DO

## 2016-09-23 NOTE — Patient Instructions (Addendum)
1.  For prevention, start propranolol 20mg  twice daily  2.  For acute migraine, take naproxen 500mg  at headache onset in combination compazine 5mg   (nausea).   You may also try tizanidine (muscle relaxer) as needed, if you are able to sleep as this may make you sleepy.  3.  Try over the counter magnesium oxide 400-600mg  daily  4.  Limit all rescue medication (tylenol, ibuprofen, naproxen) to twice per week  Return to clinic in 4 months

## 2017-01-13 ENCOUNTER — Ambulatory Visit: Payer: BC Managed Care – PPO | Admitting: Neurology

## 2017-01-13 DIAGNOSIS — Z029 Encounter for administrative examinations, unspecified: Secondary | ICD-10-CM

## 2017-01-13 NOTE — Progress Notes (Deleted)
Follow-up Visit   Date: 01/13/17    Michelle Ortiz MRN: [id number] DOB: 06/23/1991   Interim History: Michelle Ortiz is a 26 y.o. right-handed Caucasian female returning to the clinic for follow-up of migraines.  The patient was accompanied to the clinic by *** who also provides collateral information.    History of present illness: Migraines started around the age of 89 and she recalls missing a lot of school for it.  She had MRI brain which was normal and eventually migraines decreased in frequency, occurring about about twice per year.  She was doing well with respect with her migraines until she became pregnant with her first child and then started having them twice per week (02/2014 - 11/2014).  Post-partum, headache frequency improved, but more recently, she had a sinus infection and again started having migraines twice per week. Fortunately, once her infection cleared, her migraines also stopped.    Her typical migraines have not changed in character since onset.  She gets clammy, develops palpitations, and blurry vision/vision loss and about 20-minutes later, she develops a migraine. As a child, she used to have loss of peripheral vision, but has she has grown older, she now has complete blurry vision of both eyes, only able to make out colors and form which will last the duration of her migraine.  She is unable to do anything while her vision is impaired and often tries to leave work, so she can rest at home. No associated dysarthria, lightheadedness, weakness, or vertigo. Headache is throbbing over the vertex of the head which lasts about 3-4 hours. She endorses photophobia, phonophobia, nasuea and vomiting.  Now, she usually gets migraines about once per month.  She was prescribed topiramate, but she was worried about side effect of acute angle glaucoma so did not pick the medication up.    She also complain of tension headaches for the past 7 years.  Headaches are dull and she is able to  function through this.  She has these headaches daily, lasting 2-3 hours.  She takes tylenol sometimes, but she prefers not to take medications.    She does not have morning headaches, headaches that wake her up from sleeping, or worsening headaches with coughing, sneezing, laughing, or bearing down.   UPDATE ***:  Medications:  Current Outpatient Prescriptions on File Prior to Visit  Medication Sig Dispense Refill  . naproxen (NAPROSYN) 500 MG tablet Take 1 tablet (500 mg total) by mouth every 12 (twelve) hours as needed for moderate pain (severe headache). 30 tablet 0  . NEXPLANON 68 MG IMPL implant     . phentermine 37.5 MG capsule Take 37.5 mg by mouth every morning.    . prochlorperazine (COMPAZINE) 5 MG tablet Take 1 tablet (5 mg total) by mouth every 6 (six) hours as needed for nausea or vomiting. 30 tablet 0  . propranolol (INDERAL) 20 MG tablet Take 1 tablet (20 mg total) by mouth 2 (two) times daily. 60 tablet 5  . tizanidine (ZANAFLEX) 6 MG capsule Take 1 capsule (6 mg total) by mouth 2 (two) times daily as needed for muscle spasms. (Patient not taking: Reported on 09/23/2016) 90 capsule 0  . topiramate (TOPAMAX) 50 MG tablet Take 1 tablet (50 mg total) by mouth 2 (two) times daily. (Patient not taking: Reported on 09/23/2016) 60 tablet 1   No current facility-administered medications on file prior to visit.     Allergies: No Known Allergies  Review of Systems:  CONSTITUTIONAL: No fevers,  chills, night sweats, or weight loss.  EYES: No visual changes or eye pain ENT: No hearing changes.  No history of nose bleeds.   RESPIRATORY: No cough, wheezing and shortness of breath.   CARDIOVASCULAR: Negative for chest pain, and palpitations.   GI: Negative for abdominal discomfort, blood in stools or black stools.  No recent change in bowel habits.   GU:  No history of incontinence.   MUSCLOSKELETAL: No history of joint pain or swelling.  No myalgias.   SKIN: Negative for lesions,  rash, and itching.   ENDOCRINE: Negative for cold or heat intolerance, polydipsia or goiter.   PSYCH:  *** depression or anxiety symptoms.   NEURO: As Above.   Vital Signs:  There were no vitals taken for this visit.  Neurological Exam: MENTAL STATUS including orientation to time, place, person, recent and remote memory, attention span and concentration, language, and fund of knowledge is normal.  Speech is not dysarthric.  CRANIAL NERVES:  Face is symmetric.   MOTOR:  Motor strength is 5/5 in all extremities  COORDINATION/GAIT:   Gait narrow based and stable.   Data: N/A   IMPRESSION: 1.  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  Chronic tension headache  PLAN/RECOMMENDATIONS:  1.  For prevention, start propranolol 20mg  twice daily.  Titrate further as tolerable.  Common side effects discussed. We discussed medication options of topiramate or alternative medication and because she has plans for pregnancy in the next 2 years, it was decided to use a beta-blocker.  Certainly, if she does not respond to propranolol, topiramate can be tried as long as she is not actively trying to conceive 2.  For acute migraine, take naproxen 500mg  at headache onset in combination compazine 5mg   (nausea).   She can also take tizanidine, if she is able to sleep/rest 3.  Try over the counter magnesium oxide 400-600mg  daily 4.  Limit all rescue medication (tylenol, ibuprofen, naproxen) to twice per week 5.  Consider triptans, going forward  Return to clinic in ***  The duration of this appointment visit was *** minutes of face-to-face time with the patient.  Greater than 50% of this time was spent in counseling, explanation of diagnosis, planning of further management, and coordination of care.   Thank you for allowing me to participate in patient's care.  If I can answer any additional questions, I would be pleased to do so.    Sincerely,    Aryani Daffern K. Allena KatzPatel, DO

## 2017-01-14 ENCOUNTER — Encounter: Payer: Self-pay | Admitting: Neurology

## 2017-07-09 ENCOUNTER — Encounter: Payer: Self-pay | Admitting: Physician Assistant

## 2017-07-09 ENCOUNTER — Ambulatory Visit: Payer: BC Managed Care – PPO | Admitting: Physician Assistant

## 2017-07-09 VITALS — BP 117/85 | HR 70 | Temp 98.3°F | Resp 16 | Ht 68.0 in | Wt 200.2 lb

## 2017-07-09 DIAGNOSIS — E66811 Obesity, class 1: Secondary | ICD-10-CM

## 2017-07-09 DIAGNOSIS — J329 Chronic sinusitis, unspecified: Secondary | ICD-10-CM | POA: Diagnosis not present

## 2017-07-09 DIAGNOSIS — R059 Cough, unspecified: Secondary | ICD-10-CM

## 2017-07-09 DIAGNOSIS — E669 Obesity, unspecified: Secondary | ICD-10-CM

## 2017-07-09 DIAGNOSIS — R05 Cough: Secondary | ICD-10-CM | POA: Diagnosis not present

## 2017-07-09 DIAGNOSIS — Z6830 Body mass index (BMI) 30.0-30.9, adult: Secondary | ICD-10-CM | POA: Diagnosis not present

## 2017-07-09 MED ORDER — AMOXICILLIN-POT CLAVULANATE 875-125 MG PO TABS: 1.0000 | ORAL_TABLET | Freq: Two times a day (BID) | ORAL | 0 refills | Status: AC

## 2017-07-09 MED ORDER — PHENTERMINE HCL 37.5 MG PO CAPS: 37.5000 mg | ORAL_CAPSULE | ORAL | 1 refills | Status: DC

## 2017-07-09 MED ORDER — HYDROCODONE-HOMATROPINE 5-1.5 MG/5ML PO SYRP: 5.0000 mL | ORAL_SOLUTION | Freq: Three times a day (TID) | ORAL | 0 refills | Status: DC | PRN

## 2017-07-09 NOTE — Progress Notes (Signed)
Michelle Ortiz  MRN: [id number] DOB: 1991/06/08  PCP: Levi AlandAnderson, Mark E, MD  Subjective:  Pt is a 26 year old female who presents to clinic for headache x 3 weeks.  Endorses sinus pressure, headache, cough, and congestion. Cough wakes her up at night. Sinus pressure worsens when she lays down. She has drainage from her eye when she wakes up in the morning after laying on her left side.  She has taken Robitussin and Mucinex.   Obesity - She would like refill of phentermine 37.5mg . She was originally prescribed this by her GYN in 2016. She was taking this for about 3-4 weeks and lost 7 lbs. Denies medication side effects.    Review of Systems  Constitutional: Negative for chills, diaphoresis, fatigue and fever.  HENT: Positive for congestion, postnasal drip, sinus pressure and sinus pain. Negative for rhinorrhea and sneezing.   Respiratory: Positive for cough. Negative for shortness of breath and wheezing.   Cardiovascular: Negative for chest pain and palpitations.  Neurological: Positive for headaches.  Psychiatric/Behavioral: Positive for sleep disturbance.    Patient Active Problem List   Diagnosis Date Noted  . Intractable migraine with aura without status migrainosus 09/23/2016  . Chronic tension-type headache, intractable 09/23/2016  . Pregnancy 12/18/2014    Current Outpatient Medications on File Prior to Visit  Medication Sig Dispense Refill  . Dextromethorphan-Guaifenesin (MUCINEX DM) 30-600 MG TB12 Take by mouth.    . Diphenhydramine-Phenylephrine (THERAFLU COLD/COUGH NIGHTTIME PO) Take by mouth.    . NEXPLANON 68 MG IMPL implant     . Pseudoephedrine-APAP-DM (DAYQUIL PO) Take by mouth.    . naproxen (NAPROSYN) 500 MG tablet Take 1 tablet (500 mg total) by mouth every 12 (twelve) hours as needed for moderate pain (severe headache). (Patient not taking: Reported on 07/09/2017) 30 tablet 0  . phentermine 37.5 MG capsule Take 37.5 mg by mouth every morning.    . propranolol  (INDERAL) 20 MG tablet Take 1 tablet (20 mg total) by mouth 2 (two) times daily. (Patient not taking: Reported on 07/09/2017) 60 tablet 5  . tizanidine (ZANAFLEX) 6 MG capsule Take 1 capsule (6 mg total) by mouth 2 (two) times daily as needed for muscle spasms. (Patient not taking: Reported on 07/09/2017) 90 capsule 0  . topiramate (TOPAMAX) 50 MG tablet Take 1 tablet (50 mg total) by mouth 2 (two) times daily. (Patient not taking: Reported on 07/09/2017) 60 tablet 1   No current facility-administered medications on file prior to visit.     No Known Allergies   Objective:  BP 117/85   Pulse 70   Temp 98.3 F (36.8 C) (Oral)   Resp 16   Ht 5\' 8"  (1.727 m)   Wt 200 lb 3.2 oz (90.8 kg)   SpO2 97%   BMI 30.44 kg/m   Physical Exam  Constitutional: She is oriented to person, place, and time and well-developed, well-nourished, and in no distress. No distress.  HENT:  Right Ear: Tympanic membrane normal.  Left Ear: Tympanic membrane normal.  Nose: Mucosal edema present. No rhinorrhea. Right sinus exhibits no maxillary sinus tenderness and no frontal sinus tenderness. Left sinus exhibits maxillary sinus tenderness. Left sinus exhibits no frontal sinus tenderness.  Mouth/Throat: Oropharynx is clear and moist and mucous membranes are normal.  Cardiovascular: Normal rate, regular rhythm and normal heart sounds.  Pulmonary/Chest: Effort normal and breath sounds normal. No respiratory distress. She has no wheezes. She has no rales.  Neurological: She is alert and oriented to person,  place, and time. GCS score is 15.  Skin: Skin is warm and dry.  Psychiatric: Mood, memory, affect and judgment normal.  Vitals reviewed.   Assessment and Plan :  1. Cough - HYDROcodone-homatropine (HYCODAN) 5-1.5 MG/5ML syrup; Take 5 mLs by mouth every 8 (eight) hours as needed for cough.  Dispense: 120 mL; Refill: 0  2. Sinusitis, unspecified chronicity, unspecified location - amoxicillin-clavulanate  (AUGMENTIN) 875-125 MG tablet; Take 1 tablet by mouth 2 (two) times daily for 7 days.  Dispense: 14 tablet; Refill: 0 - Cough and sinus pressure x 3 weeks. Plan to treat for sinusitis. Advised nasal rinses. RTC in 10 days if no improvement.  3. Class 1 obesity without serious comorbidity with body mass index (BMI) of 30.0 to 30.9 in adult, unspecified obesity type - phentermine 37.5 MG capsule; Take 1 capsule (37.5 mg total) by mouth every morning.  Dispense: 30 capsule; Refill: 1 - Phentermine originally Rx by her GYN. Advised her to f/u with GYN for further weight loss treatment.   Marco CollieWhitney Aigner Horseman, PA-C  Primary Care at [facility] 07/09/2017 2:13 PM

## 2017-07-09 NOTE — Patient Instructions (Addendum)
Do not take phentermine or Hycodan if you are breast feeding.  Start using your neti pot. Stay well hydrated.    ACUTE VIRAL RHINOSINUSITIS-Patients with acute viral rhinosinusitis (AVRS) should be managed with supportive care. There are no treatments to shorten the clinical course of the disease.  Natural history-AVRS may not completely resolve within 10 days but is expected to improve. Patients who fail to improve after ?10 days of symptomatic management are more likely to have acute bacterial rhinosinusitis. Symptomatic therapies-Symptomatic management of acute rhinosinusitis (ARS) aims to relieve symptoms of nasal obstruction and runny nose as well as the systemic signs and symptoms such as fever and fatigue. When needed, we suggest over-the-counter (OTC) analgesics and antipyretics, saline irrigation, and intranasal glucocorticoids for symptomatic management in patients with ARS. Analgesics and antipyretics-OTC analgesics and antipyretics such as nonsteroidal anti-inflammatory drugs and acetaminophen can be used for pain and fever relief as needed. Saline irrigation-Mechanical irrigation with saline may reduce the need for pain medication and improve overall patient comfort, particularly in patients with frequent sinus infections. It is important that irrigants be prepared from sterile or bottled water. (See below for instructions)  Intranasal glucocorticoids- Intranasal glucocorticoids are likely to be most beneficial for patients with underlying allergies. This allows improved sinus drainage. A higher dose of intranasal glucocorticoids had a stronger effect on symptom improvement. -Other- ?Oral decongestants - Oral decongestants may be useful when eustachian tube dysfunction is a factor for patients with AVRS. These patients may benefit from a short course (three to five days) of oral decongestants. Oral decongestants should be used with caution in patients with cardiovascular disease,  hypertension, angle-closure glaucoma, or bladder neck obstruction. ?Intranasal decongestants - Intranasal decongestants are often used as symptomatic therapies by patients. These agents, such as oxymetazoline, may provide a subjective sense of improved nasal patency. There is also concern that intranasal decongestants themselves may provoke mucosal inflammation. If used, topical decongestants should be used sparingly for no more than three consecutive days to avoid rebound congestion, addiction, and mucosal damage associated with long-term use. ?Antihistamines - Antihistamines are frequently used for symptom relief due to their drying effects; however, there are no studies investigating their efficacy for ARS. Over-drying of the mucosa may lead to further discomfort. Additionally, antihistamines are often associated with adverse effects.  ?Mucolytics - Mucolytics such as guaifenesin serve to thin secretions and may promote ease of mucus drainage and clearance.   SALINE NASAL IRRIGATION  The benefits  1. Saline (saltwater) washes the mucus and irritants from your nose.  2. The sinus passages are moisturized.  3. Studies have also shown that a nasal irrigation improves cell function (the cells that move the mucus work better).  The recipe  Use a one-quart glass jar that is thoroughly cleansed.  You may use a large medical syringe (30 cc), water pick with an irrigation tip (preferred method), squeeze bottle, or Neti pot. Do not use a baby bulb syringe. The syringe or pick should be sterilized frequently or replaced every two to three weeks to avoid contamination and infection.  Fill with water that has been distilled, previously boiled, or otherwise sterilized. Plain tap water is not recommended, because it is not necessarily sterile.  Add 1 to 1 heaping teaspoons of pickling/canning salt. Do not use table salt, because it contains a large number of additives.  Add 1 teaspoon of baking soda (pure  bicarbonate).  Mix ingredients together, and store at room temperature. Discard after one week.  You may also make  up a solution from premixed packets that are commercially prepared specifically for nasal irrigation.  The instructions  Irrigate your nose with saline one to two times per day.   If you have been told to use nasal medication, you should always use your saline solution first. The nasal medication is much more effective when sprayed onto clean nasal membranes, and the spray will reach deeper into the nose.   Pour the amount of fluid you plan to use into a clean bowl. Do not put your used syringe back into the storage container, because it contaminates your solution.   You may warm the solution slightly in the microwave, but be sure that the solution is not hot.   Bend over the sink (some people do this in the shower), and squirt the solution into each side of your nose, aiming the stream toward the back of your head, not the top of your head. The solution should flow into one nostril and out of the other, but it will not harm you if you swallow a little.   Some people experience a little burning sensation the first few times that they use buffered saline solution, but this usually goes away after they adapt to it.     Thank you for coming in today. I hope you feel we met your needs.  Feel free to call PCP if you have any questions or further requests.  Please consider signing up for MyChart if you do not already have it, as this is a great way to communicate with me.  Best,  Whitney McVey, PA-C  IF you received an x-ray today, you will receive an invoice from [facility]. Please contact [facility] at [phone] with questions or concerns regarding your invoice.   IF you received labwork today, you will receive an invoice from [facility]. Please contact [facility] at [phone] with questions or concerns regarding your invoice.   Our billing staff will not  be able to assist you with questions regarding bills from these companies.  You will be contacted with the lab results as soon as they are available. The fastest way to get your results is to activate your My Chart account. Instructions are located on the last page of this paperwork. If you have not heard from [place] regarding the results in 2 weeks, please contact this office.

## 2017-08-06 ENCOUNTER — Encounter: Payer: Self-pay | Admitting: Physician Assistant

## 2017-08-06 ENCOUNTER — Ambulatory Visit: Payer: BC Managed Care – PPO | Admitting: Physician Assistant

## 2017-08-06 ENCOUNTER — Other Ambulatory Visit: Payer: Self-pay

## 2017-08-06 VITALS — BP 116/80 | HR 96 | Temp 98.6°F | Resp 16 | Ht 68.0 in | Wt 197.4 lb

## 2017-08-06 DIAGNOSIS — H9201 Otalgia, right ear: Secondary | ICD-10-CM | POA: Diagnosis not present

## 2017-08-06 DIAGNOSIS — B373 Candidiasis of vulva and vagina: Secondary | ICD-10-CM | POA: Diagnosis not present

## 2017-08-06 DIAGNOSIS — B3731 Acute candidiasis of vulva and vagina: Secondary | ICD-10-CM

## 2017-08-06 DIAGNOSIS — H6690 Otitis media, unspecified, unspecified ear: Secondary | ICD-10-CM | POA: Diagnosis not present

## 2017-08-06 MED ORDER — AMOXICILLIN-POT CLAVULANATE ER 1000-62.5 MG PO TB12: 2.0000 | ORAL_TABLET | Freq: Two times a day (BID) | ORAL | 0 refills | Status: AC

## 2017-08-06 MED ORDER — FLUCONAZOLE 150 MG PO TABS: 150.0000 mg | ORAL_TABLET | Freq: Once | ORAL | 0 refills | Status: AC

## 2017-08-06 NOTE — Patient Instructions (Addendum)
Continue with your nasal rinses.  Come back if you are not improving in 7-10 days.    Otitis Media, Adult Otitis media occurs when there is inflammation and fluid in the middle ear. Your middle ear is a part of the ear that contains bones for hearing as well as air that helps send sounds to your brain. What are the causes? This condition is caused by a blockage in the eustachian tube. This tube drains fluid from the ear to the back of the nose (nasopharynx). A blockage in this tube can be caused by an object or by swelling (edema) in the tube. Problems that can cause a blockage include:  A cold or other upper respiratory infection.  Allergies.  An irritant, such as tobacco smoke.  Enlarged adenoids. The adenoids are areas of soft tissue located high in the back of the throat, behind the nose and the roof of the mouth.  A mass in the nasopharynx.  Damage to the ear caused by pressure changes (barotrauma).  What are the signs or symptoms? Symptoms of this condition include:  Ear pain.  A fever.  Decreased hearing.  A headache.  Tiredness (lethargy).  Fluid leaking from the ear.  Ringing in the ear.  How is this diagnosed? This condition is diagnosed with a physical exam. During the exam your health care provider will use an instrument called an otoscope to look into your ear and check for redness, swelling, and fluid. He or she will also ask about your symptoms. Your health care provider may also order tests, such as:  A test to check the movement of the eardrum (pneumatic otoscopy). This test is done by squeezing a small amount of air into the ear.  A test that changes air pressure in the middle ear to check how well the eardrum moves and whether the eustachian tube is working (tympanogram).  How is this treated? This condition usually goes away on its own within 3-5 days. But if the condition is caused by a bacteria infection and does not go away own its own, or keeps  coming back, your health care provider may:  Prescribe antibiotic medicines to treat the infection.  Prescribe or recommend medicines to control pain.  Follow these instructions at home:  Take over-the-counter and prescription medicines only as told by your health care provider.  If you were prescribed an antibiotic medicine, take it as told by your health care provider. Do not stop taking the antibiotic even if you start to feel better.  Keep all follow-up visits as told by your health care provider. This is important. Contact a health care provider if:  You have bleeding from your nose.  There is a lump on your neck.  You are not getting better in 5 days.  You feel worse instead of better. Get help right away if:  You have severe pain that is not controlled with medicine.  You have swelling, redness, or pain around your ear.  You have stiffness in your neck.  A part of your face is paralyzed.  The bone behind your ear (mastoid) is tender when you touch it.  You develop a severe headache. Summary  Otitis media is redness, soreness, and swelling of the middle ear.  This condition usually goes away on its own within 3-5 days.  If the problem does not go away in 3-5 days, your health care provider may prescribe or recommend medicines to treat your symptoms.  If you were prescribed an antibiotic  medicine, take it as told by your health care provider. This information is not intended to replace advice given to you by your health care provider. Make sure you discuss any questions you have with your health care provider. Document Released: 04/19/2004 Document Revised: 07/05/2016 Document Reviewed: 07/05/2016 Elsevier Interactive Patient Education  2018 [employer].   IF you received an x-ray today, you will receive an invoice from [facility]. Please contact [facility] at [phone] with questions or concerns regarding your invoice.   IF you  received labwork today, you will receive an invoice from [facility]. Please contact [facility] at [phone] with questions or concerns regarding your invoice.   Our billing staff will not be able to assist you with questions regarding bills from these companies.  You will be contacted with the lab results as soon as they are available. The fastest way to get your results is to activate your My Chart account. Instructions are located on the last page of this paperwork. If you have not heard from [place] regarding the results in 2 weeks, please contact this office.

## 2017-08-06 NOTE — Progress Notes (Signed)
Michelle Ortiz  MRN: [id number] DOB: 02-26-1991  PCP: Levi AlandAnderson, Mark E, MD  Subjective:  Pt is a 27 year old female who presents to clinic for ear fullness x 5 days. She has had a cold for about one month. Treated for sinusitis a few weeks ago.  Four days ago felt sharp pain of right ear. Since that time hearing from right ear gradually worsening.   She is using nasal rinses and over the counter decongestants.  ROS below.    Review of Systems  Constitutional: Negative for chills, diaphoresis, fatigue and fever.  HENT: Positive for congestion, ear pain, hearing loss, postnasal drip, sinus pressure and tinnitus. Negative for ear discharge, rhinorrhea and sinus pain.   Respiratory: Positive for cough. Negative for chest tightness, shortness of breath and wheezing.   Cardiovascular: Negative for chest pain and palpitations.    Patient Active Problem List   Diagnosis Date Noted  . Intractable migraine with aura without status migrainosus 09/23/2016  . Chronic tension-type headache, intractable 09/23/2016    Current Outpatient Medications on File Prior to Visit  Medication Sig Dispense Refill  . Dextromethorphan-Guaifenesin (MUCINEX DM) 30-600 MG TB12 Take by mouth.    . Diphenhydramine-Phenylephrine (THERAFLU COLD/COUGH NIGHTTIME PO) Take by mouth.    . NEXPLANON 68 MG IMPL implant     . phentermine 37.5 MG capsule Take 1 capsule (37.5 mg total) by mouth every morning. 30 capsule 1  . Pseudoephedrine-APAP-DM (DAYQUIL PO) Take by mouth.    . tizanidine (ZANAFLEX) 6 MG capsule Take 1 capsule (6 mg total) by mouth 2 (two) times daily as needed for muscle spasms. 90 capsule 0  . naproxen (NAPROSYN) 500 MG tablet Take 1 tablet (500 mg total) by mouth every 12 (twelve) hours as needed for moderate pain (severe headache). (Patient not taking: Reported on 07/09/2017) 30 tablet 0  . propranolol (INDERAL) 20 MG tablet Take 1 tablet (20 mg total) by mouth 2 (two) times daily. (Patient not taking:  Reported on 07/09/2017) 60 tablet 5  . topiramate (TOPAMAX) 50 MG tablet Take 1 tablet (50 mg total) by mouth 2 (two) times daily. (Patient not taking: Reported on 07/09/2017) 60 tablet 1   No current facility-administered medications on file prior to visit.     No Known Allergies   Objective:  BP 116/80   Pulse 96   Temp 98.6 F (37 C) (Oral)   Resp 16   Ht 5\' 8"  (1.727 m)   Wt 197 lb 6.4 oz (89.5 kg)   SpO2 100%   BMI 30.01 kg/m   Physical Exam  Constitutional: She is oriented to person, place, and time and well-developed, well-nourished, and in no distress. No distress.  HENT:  Right Ear: No drainage. Tympanic membrane is erythematous and bulging. A middle ear effusion is present.  Left Ear: Tympanic membrane is retracted.  Nose: Mucosal edema present. No rhinorrhea. Right sinus exhibits no maxillary sinus tenderness and no frontal sinus tenderness. Left sinus exhibits no maxillary sinus tenderness and no frontal sinus tenderness.  Mouth/Throat: Oropharynx is clear and moist and mucous membranes are normal.  Cardiovascular: Normal rate, regular rhythm and normal heart sounds.  Pulmonary/Chest: Effort normal and breath sounds normal. No respiratory distress. She has no wheezes. She has no rales.  Neurological: She is alert and oriented to person, place, and time. GCS score is 15.  Skin: Skin is warm and dry.  Psychiatric: Mood, memory, affect and judgment normal.  Vitals reviewed.   Assessment and Plan :  1. Acute otitis media, unspecified otitis media type 2. Right ear pain - amoxicillin-clavulanate (AUGMENTIN XR) 1000-62.5 MG 12 hr tablet; Take 2 tablets by mouth 2 (two) times daily for 7 days.  Dispense: 28 tablet; Refill: 0 - Plan to increase strength as she was treated for sinus infection last month. Con't nasal rinses. RTC in 7 days if no improvement.  3. Yeast vaginitis - fluconazole (DIFLUCAN) 150 MG tablet; Take 1 tablet (150 mg total) by mouth once for 1 dose.  Repeat if needed  Dispense: 2 tablet; Refill: 0  Marco Collie, PA-C  Primary Care at [facility] 08/06/2017 3:01 PM

## 2017-09-23 ENCOUNTER — Other Ambulatory Visit: Payer: Self-pay

## 2017-09-23 ENCOUNTER — Encounter: Payer: Self-pay | Admitting: Physician Assistant

## 2017-09-23 ENCOUNTER — Ambulatory Visit (INDEPENDENT_AMBULATORY_CARE_PROVIDER_SITE_OTHER): Payer: BC Managed Care – PPO | Admitting: Physician Assistant

## 2017-09-23 VITALS — BP 108/68 | HR 83 | Temp 98.7°F | Resp 16 | Ht 68.5 in | Wt 192.0 lb

## 2017-09-23 DIAGNOSIS — B373 Candidiasis of vulva and vagina: Secondary | ICD-10-CM

## 2017-09-23 DIAGNOSIS — H9201 Otalgia, right ear: Secondary | ICD-10-CM | POA: Diagnosis not present

## 2017-09-23 DIAGNOSIS — H6690 Otitis media, unspecified, unspecified ear: Secondary | ICD-10-CM

## 2017-09-23 DIAGNOSIS — B3731 Acute candidiasis of vulva and vagina: Secondary | ICD-10-CM

## 2017-09-23 MED ORDER — FLUCONAZOLE 150 MG PO TABS: 150.0000 mg | ORAL_TABLET | Freq: Once | ORAL | 0 refills | Status: AC

## 2017-09-23 MED ORDER — AMOXICILLIN-POT CLAVULANATE 875-125 MG PO TABS: 1.0000 | ORAL_TABLET | Freq: Two times a day (BID) | ORAL | 0 refills | Status: AC

## 2017-09-23 MED ORDER — OFLOXACIN 0.3 % OT SOLN: 10.0000 [drp] | Freq: Every day | OTIC | 0 refills | Status: AC

## 2017-09-23 NOTE — Patient Instructions (Addendum)
You will receive a phone call to schedule an appt with ENT within the next 7 days.   Ofloxicin: Instill 10 drops into affected ear twice daily for 14 days  amoxicillin-clavulanate:  orally twice daily for 10 days  Try hard not to get water in your ear.   Eardrum Rupture, Adult An eardrum rupture is a hole (perforation) in the eardrum. The eardrum is a thin, round tissue inside of the ear that separates the ear canal from the middle ear. The eardrum is also called the tympanic membrane. It transfers sound vibrations through small bones in the middle ear to the hearing nerve in the inner ear. It also protects the middle ear from germs. An eardrum rupture can cause pain and hearing loss. What are the causes? This condition may be caused by:  An infection.  A sudden injury, such as from: ? Inserting a thin, sharp object into the ear. ? A hit to the side of the head, especially by an open hand. ? Falling onto water or a flat surface. ? A rapid change in pressure, such as from flying or scuba diving. ? A sudden increase in pressure against the eardrum, such as from an explosion or a very loud noise.  Inserting a cotton-tipped swab in the ear.  A long-term eustachian tube disorder. Eustachian tubes are parts of the body that connect each middle ear space to the back of the nose.  A medical procedure or surgery, such as a procedure to remove wax from the ear canal.  Removing a man-made pressure equalization tube(PE tube) that was placed through the eardrum.  Having a PE tube fall out.  What increases the risk? You are more likely to develop this condition if:  You have had PE tubes inserted in your ears.  You have an ear infection.  You play sports that: ? Involve balls or contact with other players. ? Take place in water, such as diving, scuba diving, or waterskiing.  What are the signs or symptoms? Symptoms of this condition include:  Sudden pain at the time of the  injury.  Ear pain that suddenly improves.  Ringing in the ear after the injury.  Drainage from the ear. The drainage may be clear, cloudy or pus-like, or bloody.  Hearing loss.  Dizziness.  How is this diagnosed? This condition is diagnosed based on your symptoms and medical history as well as a physical exam. Your health care provider can usually see a perforation using an ear scope (otoscope). You may have tests, such as:  A hearing test (audiogram) to check for hearing loss.  A test in which a sample of ear drainage is tested for infection (culture).  How is this treated? An eardrum typically heals on its own within a few weeks. If your eardrum does not heal, your health care provider may recommend a procedure to place a patch over your eardrum or surgery to repair your eardrum. Your health care provider may also prescribe antibiotic medicines to help prevent infection. If the ear heals completely, any hearing loss should be temporary. Follow these instructions at home:  Keep your ear dry. This is very important. Follow instructions from your health care provider about how to keep your ear dry. You may need to wear waterproof earplugs when bathing and swimming.  Take over-the-counter and prescription medicines only as told by your health care provider.  Return to sports and activities as told by your health care provider. Ask your health care provider what  activities are safe for you.  Wear headgear with ear protection when you play sports in which ear injuries are common.  If directed, apply heat to your affected ear as often as told by your health care provider. Use the heat source that your health care provider recommends, such as a moist heat pack or a heating pad. This will help to relieve pain. ? Place a towel between your skin and the heat source. ? Leave the heat on for 20-30 minutes. ? Remove the heat if your skin turns bright red. This is especially important if you  are unable to feel pain, heat, or cold. You may have a greater risk of getting burned.  Keep all follow-up visits as told by your health care provider. This is important.  Talk to your health care provider before traveling by plane. Contact a health care provider if:  You have mucus or blood draining from your ear.  You have a fever.  You have ear pain.  You have hearing loss, dizziness, or ringing in your ear. Get help right away if:  You have sudden hearing loss.  You are very dizzy.  You have severe ear pain.  Your face feels weak or becomes paralyzed. Summary  An eardrum rupture is a hole (perforation) in the eardrum that can cause pain and hearing loss. It is usually caused by a sudden injury to the ear.  The eardrum will likely heal on its own within a few weeks. In some cases, surgery may be necessary.  After the injury, follow instructions from your health care provider about how to keep your ear dry as it heals. This information is not intended to replace advice given to you by your health care provider. Make sure you discuss any questions you have with your health care provider. Document Released: 07/12/2000 Document Revised: 09/20/2016 Document Reviewed: 09/20/2016 Elsevier Interactive Patient Education  2018 [employer].  IF you received an x-ray today, you will receive an invoice from [facility]. Please contact [facility] at [phone] with questions or concerns regarding your invoice.   IF you received labwork today, you will receive an invoice from [facility]. Please contact [facility] at [phone] with questions or concerns regarding your invoice.   Our billing staff will not be able to assist you with questions regarding bills from these companies.  You will be contacted with the lab results as soon as they are available. The fastest way to get your results is to activate your My Chart account. Instructions are located on the last page  of this paperwork. If you have not heard from [place] regarding the results in 2 weeks, please contact this office.

## 2017-09-23 NOTE — Progress Notes (Signed)
   Michelle Ortiz  MRN: [id number] DOB: 30-Jun-1991  PCP: Levi AlandAnderson, Mark E, MD  Subjective:  Pt is a 27 year old female who presents to clinic for right ear pain x 1 week. Endorses yellow and green drainage and hearing loss.  She uses a nasal bulb to clean sinuses daily. She used this a few days ago and it brought her to tears because of pain of right ear. She is taking Aleve for pain.   She was here 1/9 and treated for otitis media. She was treated for sinusitis two months ago.    Review of Systems  Constitutional: Negative for chills, diaphoresis and fever.  HENT: Positive for congestion, ear discharge, ear pain and hearing loss. Negative for facial swelling, sinus pressure, sinus pain and sore throat.     Patient Active Problem List   Diagnosis Date Noted  . Intractable migraine with aura without status migrainosus 09/23/2016  . Chronic tension-type headache, intractable 09/23/2016    Current Outpatient Medications on File Prior to Visit  Medication Sig Dispense Refill  . NEXPLANON 68 MG IMPL implant      No current facility-administered medications on file prior to visit.     No Known Allergies   Objective:  BP 108/68   Pulse 83   Temp 98.7 F (37.1 C) (Oral)   Resp 16   Ht 5' 8.5" (1.74 m)   Wt 192 lb (87.1 kg)   SpO2 98%   BMI 28.77 kg/m   Physical Exam  Constitutional: She is oriented to person, place, and time and well-developed, well-nourished, and in no distress. No distress.  HENT:  Right Ear: No drainage or tenderness. No mastoid tenderness. Tympanic membrane is perforated and bulging. Decreased hearing is noted.  Left Ear: Tympanic membrane normal.  Nose: Mucosal edema present. No rhinorrhea. Right sinus exhibits no maxillary sinus tenderness and no frontal sinus tenderness. Left sinus exhibits no maxillary sinus tenderness and no frontal sinus tenderness.  Mouth/Throat: Oropharynx is clear and moist and mucous membranes are normal.  Cardiovascular: Normal  rate, regular rhythm and normal heart sounds.  Pulmonary/Chest: Effort normal and breath sounds normal. No respiratory distress. She has no wheezes. She has no rales.  Neurological: She is alert and oriented to person, place, and time. GCS score is 15.  Skin: Skin is warm and dry.  Psychiatric: Mood, memory, affect and judgment normal.  Vitals reviewed.   Assessment and Plan :  1. Acute otitis media, unspecified otitis media type 2. Right ear pain - Ambulatory referral to ENT - ofloxacin (FLOXIN OTIC) 0.3 % OTIC solution; Place 10 drops into the right ear daily for 14 days.  Dispense: 10 mL; Refill: 0 - amoxicillin-clavulanate (AUGMENTIN) 875-125 MG tablet; Take 1 tablet by mouth 2 (two) times daily for 10 days.  Dispense: 20 tablet; Refill: 0 - Pt c/o right ear pain and drainage x 1 week. Plan to treat for otitis media with perforation. Plan to refer to ENT for eval as this is her third infection in 3 months. She agrees with plan.   3. Vaginal yeast infection - fluconazole (DIFLUCAN) 150 MG tablet; Take 1 tablet (150 mg total) by mouth once for 1 dose. Repeat if needed  Dispense: 2 tablet; Refill: 0   Marco CollieWhitney Dwan Hemmelgarn, PA-C  Primary Care at [facility] 09/23/2017 1:55 PM

## 2018-04-03 ENCOUNTER — Ambulatory Visit: Payer: BC Managed Care – PPO | Admitting: Physician Assistant

## 2018-04-03 ENCOUNTER — Encounter: Payer: Self-pay | Admitting: Physician Assistant

## 2018-04-03 VITALS — BP 108/73 | HR 85 | Temp 99.6°F | Resp 16 | Ht 68.0 in | Wt 192.6 lb

## 2018-04-03 DIAGNOSIS — Z3200 Encounter for pregnancy test, result unknown: Secondary | ICD-10-CM

## 2018-04-03 DIAGNOSIS — N926 Irregular menstruation, unspecified: Secondary | ICD-10-CM

## 2018-04-03 LAB — POCT URINE PREGNANCY: Preg Test, Ur: NEGATIVE

## 2018-04-03 NOTE — Patient Instructions (Addendum)
  Your pregnancy test is negative.   Take a Prenatal Vitamin each day. Take 1 mg Folic acid daily. You can purchase one over-the-counter. These can sometimes cause nausea and constipation. If you do not tolerate them, you may take a children's multi-vitamin tablet daily until you can discuss other options with your obstetrician.  Drink at least 64 ounces of water each day. I recommend that you obtain a 32 ounce water bottle. Fill it each morning, and drink that amount by lunch time. Refill the bottle and drink that amount by supper. This will help you get enough water, and reduce the frequency you get up during the night to urinate.    IF you received an x-ray today, you will receive an invoice from [facility]. Please contact [facility] at [phone] with questions or concerns regarding your invoice.   IF you received labwork today, you will receive an invoice from [facility]. Please contact [facility] at [phone] with questions or concerns regarding your invoice.   Our billing staff will not be able to assist you with questions regarding bills from these companies.  You will be contacted with the lab results as soon as they are available. The fastest way to get your results is to activate your My Chart account. Instructions are located on the last page of this paperwork. If you have not heard from [place] regarding the results in 2 weeks, please contact this office.

## 2018-04-03 NOTE — Progress Notes (Signed)
   Michelle Ortiz  MRN: [id number] DOB: [birth year]  PCP: Levi Aland, MD  Subjective:  Pt is a G4P63 27 year old female who presents to clinic for possible pregnancy.  Home pregnancy test was positive.  Denies fever, chills, nausea or vomiting, abdominal pain, pelvic pain, back pain. LMP Aug 7  Review of Systems  Constitutional: Negative for chills and fever.  Gastrointestinal: Negative for abdominal pain.  Genitourinary: Positive for menstrual problem (missed period). Negative for vaginal bleeding, vaginal discharge and vaginal pain.  Musculoskeletal: Negative for back pain.    Patient Active Problem List   Diagnosis Date Noted  . Intractable migraine with aura without status migrainosus 09/23/2016  . Chronic tension-type headache, intractable 09/23/2016    No current outpatient medications on file prior to visit.   No current facility-administered medications on file prior to visit.     No Known Allergies   Objective:  BP 108/73 (BP Location: Right Arm, Patient Position: Sitting, Cuff Size: Large)   Pulse 85   Temp 99.6 F (37.6 C) (Oral)   Resp 16   Ht 5\' 8"  (1.727 m)   Wt 192 lb 9.6 oz (87.4 kg)   LMP 03/04/2018   SpO2 99%   BMI 29.28 kg/m   Physical Exam  Constitutional: She is oriented to person, place, and time. No distress.  Cardiovascular: Normal rate, regular rhythm and normal heart sounds.  Neurological: She is alert and oriented to person, place, and time.  Skin: Skin is warm and dry.  Psychiatric: Judgment normal.  Vitals reviewed.  Preg Test, Ur Negative  Assessment and Plan :  1. Possible pregnancy 2. Missed period - pt presents for pregnancy test with a positive home pregnancy test. Today's urine hcg is negative. It is possible it is too soon for urine detection of hcg. Will do serum hcg. Will contact with results.  - POCT urine pregnancy - Beta hCG quant (ref lab)   Marco Collie, PA-C  Primary [facility] at [facility] 04/03/2018 4:34 PM  Please note: Portions of this report may have been transcribed using dragon voice recognition software. Every effort was made to ensure accuracy; however, inadvertent computerized transcription errors may be present.

## 2018-04-04 LAB — BETA HCG QUANT (REF LAB): hCG Quant: 76 m[IU]/mL

## 2018-04-06 ENCOUNTER — Encounter: Payer: Self-pay | Admitting: Physician Assistant

## 2018-06-12 ENCOUNTER — Encounter: Payer: Self-pay | Admitting: Physician Assistant

## 2018-06-12 DIAGNOSIS — I83819 Varicose veins of unspecified lower extremities with pain: Secondary | ICD-10-CM | POA: Insufficient documentation

## 2018-06-13 ENCOUNTER — Encounter (HOSPITAL_COMMUNITY): Payer: Self-pay | Admitting: *Deleted

## 2018-06-13 ENCOUNTER — Inpatient Hospital Stay (HOSPITAL_COMMUNITY)
Admission: AD | Admit: 2018-06-13 | Discharge: 2018-06-13 | Disposition: A | Discharge status: Home / Self Care | Payer: BC Managed Care – PPO | Source: Ambulatory Visit | Attending: Obstetrics and Gynecology | Admitting: Obstetrics and Gynecology

## 2018-06-13 ENCOUNTER — Other Ambulatory Visit: Payer: Self-pay

## 2018-06-13 DIAGNOSIS — O219 Vomiting of pregnancy, unspecified: Secondary | ICD-10-CM | POA: Insufficient documentation

## 2018-06-13 DIAGNOSIS — Z888 Allergy status to other drugs, medicaments and biological substances status: Secondary | ICD-10-CM | POA: Insufficient documentation

## 2018-06-13 DIAGNOSIS — Z3A14 14 weeks gestation of pregnancy: Secondary | ICD-10-CM | POA: Insufficient documentation

## 2018-06-13 HISTORY — DX: Migraine, unspecified, not intractable, without status migrainosus: G43.909

## 2018-06-13 LAB — URINALYSIS, ROUTINE W REFLEX MICROSCOPIC
Bilirubin Urine: NEGATIVE
Glucose, UA: NEGATIVE mg/dL
Hgb urine dipstick: NEGATIVE
Ketones, ur: 20 mg/dL — AB
Nitrite: NEGATIVE
Protein, ur: NEGATIVE mg/dL
Specific Gravity, Urine: 1.021 (ref 1.005–1.030)
pH: 6 (ref 5.0–8.0)

## 2018-06-13 MED ORDER — DEXTROSE IN LACTATED RINGERS 5 % IV SOLN
Freq: Once | INTRAVENOUS | Status: AC
  Administered 2018-06-13: 999 mL via INTRAVENOUS

## 2018-06-13 MED ORDER — ONDANSETRON 8 MG PO TBDP
8.0000 mg | ORAL_TABLET | Freq: Once | ORAL | Status: AC
  Administered 2018-06-13: 8 mg via ORAL
  Filled 2018-06-13: qty 1

## 2018-06-13 MED ORDER — FAMOTIDINE IN NACL 20-0.9 MG/50ML-% IV SOLN
20.0000 mg | Freq: Once | INTRAVENOUS | Status: AC
  Administered 2018-06-13: 20 mg via INTRAVENOUS
  Filled 2018-06-13: qty 50

## 2018-06-13 MED ORDER — METOCLOPRAMIDE HCL 10 MG PO TABS: 10.0000 mg | ORAL_TABLET | Freq: Three times a day (TID) | ORAL | 1 refills | Status: DC

## 2018-06-13 MED ORDER — METOCLOPRAMIDE HCL 10 MG PO TABS
10.0000 mg | ORAL_TABLET | Freq: Once | ORAL | Status: AC
  Administered 2018-06-13: 10 mg via ORAL
  Filled 2018-06-13: qty 1

## 2018-06-13 NOTE — MAU Provider Note (Addendum)
History     CSN: [id number]  Arrival date and time: 06/13/18 [id number]   First Provider Initiated Contact with Patient 06/13/18 1950      Chief Complaint  Patient presents with  . Emesis During Pregnancy   HPI  Michelle Ortiz is a 27 y.o. G2P1001 at 4572w3d who presents to MAU with chief complaint of severe nausea and vomiting since 2pm yesterday. Patient endorses approximately 8 episodes of vomiting and dry heaving in past 24 hours. This is a recurring problem. Patient typically manages her nausea with Zofran but her insurance only refills the prescription every 21 days and she is out of pills. Patient has previously tried Phenergan but does not like the feeling of extreme sleepiness it gives her. Denies vaginal bleeding, leaking of fluid, decreased fetal movement, fever, falls, or recent illness.     OB History    Gravida  2   Para  1   Term  1   Preterm      AB      Living  1     SAB      TAB      Ectopic      Multiple  0   Live Births  1           Past Medical History:  Diagnosis Date  . Headache(784.0) HEADACHES AS TEEN NONE IN 3 YEARS  . Migraine     Past Surgical History:  Procedure Laterality Date  . ANKLE RECONSTRUCTION  10/03/2011   Procedure: RECONSTRUCTION ANKLE;  Surgeon: Toni ArthursJohn Hewitt, MD;  Location: [facility];  Service: Orthopedics;  Laterality: Left;  left ankle modified brostrum lateral ligament reconstruction  . ROOT CANAL    . WISDOM TOOTH EXTRACTION      Family History  Problem Relation Age of Onset  . Hypertension Mother   . Thyroid disease Mother   . Hypertension Father   . Thyroid disease Sister     Social History   Tobacco Use  . Smoking status: Never Smoker  . Smokeless tobacco: Never Used  Substance Use Topics  . Alcohol use: No  . Drug use: No    Allergies:  Allergies  Allergen Reactions  . Tape Rash    Adhesive     No medications prior to admission.    Review of Systems  Constitutional: Negative  for chills, fatigue and fever.  Gastrointestinal: Positive for nausea and vomiting.  Genitourinary: Negative for vaginal bleeding, vaginal discharge and vaginal pain.  Musculoskeletal: Negative for back pain.  Neurological: Negative for dizziness, weakness and headaches.  All other systems reviewed and are negative.  Physical Exam   Blood pressure 119/82, pulse 97, temperature 98.5 F (36.9 C), resp. rate 18, height 5\' 9"  (1.753 m), weight 88.9 kg, last menstrual period 03/04/2018, unknown if currently breastfeeding.  Physical Exam  Nursing note and vitals reviewed. Constitutional: She is oriented to person, place, and time. She appears well-developed and well-nourished.  Cardiovascular: Normal rate.  Respiratory: Effort normal.  GI: Soft. She exhibits no distension. There is no tenderness. There is no rebound, no guarding and no CVA tenderness.  Genitourinary:  Genitourinary Comments: Not evaluated based on chief complaint  Neurological: She is alert and oriented to person, place, and time.  Skin: Skin is warm and dry.  Psychiatric: She has a normal mood and affect. Her behavior is normal. Judgment and thought content normal.    MAU Course  Procedures  Report given to J. Jacquelynn Friend, NP, who assumes care  at this time  Clayton Bibles, [place] 06/13/18  8:48 PM    Patient feeling much better, tolerating PO fluids  Zofran 8 mg PO ODT given prior to DC  Assessment and Plan   A:  1. Nausea and vomiting in pregnancy   2. [redacted] weeks gestation of pregnancy     P:  Discharge home in stable condition  Return To mau if symptoms worsen  Rx: Reglan  F/U with OB as scheduled Small, Frequent meals  Kinzley Savell, Harolyn Rutherford, NP 06/14/2018 12:07 AM

## 2018-06-13 NOTE — Discharge Instructions (Signed)
Morning Sickness °Morning sickness is when you feel sick to your stomach (nauseous) during pregnancy. This nauseous feeling may or may not come with vomiting. It often occurs in the morning but can be a problem any time of day. Morning sickness is most common during the first trimester, but it may continue throughout pregnancy. While morning sickness is unpleasant, it is usually harmless unless you develop severe and continual vomiting (hyperemesis gravidarum). This condition requires more intense treatment. °What are the causes? °The cause of morning sickness is not completely known but seems to be related to normal hormonal changes that occur in pregnancy. °What increases the risk? °You are at greater risk if you: °· Experienced nausea or vomiting before your pregnancy. °· Had morning sickness during a previous pregnancy. °· Are pregnant with more than one baby, such as twins. ° °How is this treated? °Do not use any medicines (prescription, over-the-counter, or herbal) for morning sickness without first talking to your health care provider. Your health care provider may prescribe or recommend: °· Vitamin B6 supplements. °· Anti-nausea medicines. °· The herbal medicine ginger. ° °Follow these instructions at home: °· Only take over-the-counter or prescription medicines as directed by your health care provider. °· Taking multivitamins before getting pregnant can prevent or decrease the severity of morning sickness in most women. °· Eat a piece of dry toast or unsalted crackers before getting out of bed in the morning. °· Eat five or six small meals a day. °· Eat dry and bland foods (rice, baked potato). Foods high in carbohydrates are often helpful. °· Do not drink liquids with your meals. Drink liquids between meals. °· Avoid greasy, fatty, and spicy foods. °· Get someone to cook for you if the smell of any food causes nausea and vomiting. °· If you feel nauseous after taking prenatal vitamins, take the vitamins at  night or with a snack. °· Snack on protein foods (nuts, yogurt, cheese) between meals if you are hungry. °· Eat unsweetened gelatins for desserts. °· Wearing an acupressure wristband (worn for sea sickness) may be helpful. °· Acupuncture may be helpful. °· Do not smoke. °· Get a humidifier to keep the air in your house free of odors. °· Get plenty of fresh air. °Contact a health care provider if: °· Your home remedies are not working, and you need medicine. °· You feel dizzy or lightheaded. °· You are losing weight. °Get help right away if: °· You have persistent and uncontrolled nausea and vomiting. °· You pass out (faint). °This information is not intended to replace advice given to you by your health care provider. Make sure you discuss any questions you have with your health care provider. °Document Released: 09/05/2006 Document Revised: 12/21/2015 Document Reviewed: 12/30/2012 °Elsevier Interactive Patient Education © 2017 Elsevier Inc. ° °

## 2018-06-13 NOTE — MAU Note (Signed)
Since Fri about 1400 have had n/v. Unable to keep down much since then. Has Zofran script but ins only pays every 21 days. Promethazine makes too sleepy. Has headache. No diarrhea

## 2018-07-29 NOTE — L&D Delivery Note (Signed)
Per patient was at home at 9:37am with painful contractions starting 11pm, stated with last baby had been sent home from MAU multiple times so was trying to delay proceeding to hospital, states this morning ctx got very painful and had sudden severe urge to push, had to lay on floor and after 5 pushes baby delivered with fire dept staff present. NSVD female infant, Apgars not documented, weight pending.   The patient had a second degree laceration repaired with 2-0 vicryl. Fundus was firm. EBL difficult to determine as unknown blood loss prior to arrival, at my arrival pt comfortable and bleeding minimal, fundus firm, pt did not receive pitocin, no IV placed.  approx 50cc blood loss. Placenta was delivered intact. Vagina was clear.  Baby doing skin to skin with mother.  Michelle Ortiz

## 2018-10-13 ENCOUNTER — Inpatient Hospital Stay (HOSPITAL_COMMUNITY): Payer: BC Managed Care – PPO

## 2018-10-13 ENCOUNTER — Inpatient Hospital Stay (HOSPITAL_COMMUNITY)
Admission: AD | Admit: 2018-10-13 | Discharge: 2018-10-14 | DRG: 833 | Disposition: A | Discharge status: Home / Self Care | Payer: BC Managed Care – PPO | Attending: Obstetrics and Gynecology | Admitting: Obstetrics and Gynecology

## 2018-10-13 ENCOUNTER — Other Ambulatory Visit: Payer: Self-pay

## 2018-10-13 ENCOUNTER — Encounter (HOSPITAL_COMMUNITY): Payer: Self-pay | Admitting: *Deleted

## 2018-10-13 DIAGNOSIS — Z3A31 31 weeks gestation of pregnancy: Secondary | ICD-10-CM | POA: Diagnosis not present

## 2018-10-13 DIAGNOSIS — O368130 Decreased fetal movements, third trimester, not applicable or unspecified: Secondary | ICD-10-CM | POA: Diagnosis present

## 2018-10-13 DIAGNOSIS — O99213 Obesity complicating pregnancy, third trimester: Secondary | ICD-10-CM | POA: Diagnosis not present

## 2018-10-13 DIAGNOSIS — Z363 Encounter for antenatal screening for malformations: Secondary | ICD-10-CM | POA: Diagnosis not present

## 2018-10-13 DIAGNOSIS — O368190 Decreased fetal movements, unspecified trimester, not applicable or unspecified: Secondary | ICD-10-CM | POA: Diagnosis present

## 2018-10-13 LAB — CBC
HCT: 31.7 % — ABNORMAL LOW (ref 36.0–46.0)
Hemoglobin: 10.1 g/dL — ABNORMAL LOW (ref 12.0–15.0)
MCH: 26.3 pg (ref 26.0–34.0)
MCHC: 31.9 g/dL (ref 30.0–36.0)
MCV: 82.6 fL (ref 80.0–100.0)
NRBC: 0 % (ref 0.0–0.2)
PLATELETS: 211 10*3/uL (ref 150–400)
RBC: 3.84 MIL/uL — ABNORMAL LOW (ref 3.87–5.11)
RDW: 13.2 % (ref 11.5–15.5)
WBC: 9.1 10*3/uL (ref 4.0–10.5)

## 2018-10-13 LAB — TYPE AND SCREEN
ABO/RH(D): B POS
Antibody Screen: NEGATIVE

## 2018-10-13 MED ORDER — CALCIUM CARBONATE ANTACID 500 MG PO CHEW: 2.0000 | CHEWABLE_TABLET | ORAL | Status: DC | PRN

## 2018-10-13 MED ORDER — PRENATAL MULTIVITAMIN CH
1.0000 | ORAL_TABLET | Freq: Every day | ORAL | Status: DC
  Administered 2018-10-14: 1 via ORAL
  Filled 2018-10-13: qty 1

## 2018-10-13 MED ORDER — SODIUM CHLORIDE 0.9% FLUSH
3.0000 mL | Freq: Two times a day (BID) | INTRAVENOUS | Status: DC
  Administered 2018-10-13: 3 mL via INTRAVENOUS

## 2018-10-13 MED ORDER — ZOLPIDEM TARTRATE 5 MG PO TABS
5.0000 mg | ORAL_TABLET | Freq: Every evening | ORAL | Status: DC | PRN
  Administered 2018-10-13: 5 mg via ORAL
  Filled 2018-10-13: qty 1

## 2018-10-13 MED ORDER — ACETAMINOPHEN 325 MG PO TABS: 650.0000 mg | ORAL_TABLET | ORAL | Status: DC | PRN

## 2018-10-13 MED ORDER — BETAMETHASONE SOD PHOS & ACET 6 (3-3) MG/ML IJ SUSP
12.0000 mg | INTRAMUSCULAR | Status: AC
  Administered 2018-10-13 – 2018-10-14 (×2): 12 mg via INTRAMUSCULAR
  Filled 2018-10-13 (×2): qty 2

## 2018-10-13 MED ORDER — DOCUSATE SODIUM 100 MG PO CAPS
100.0000 mg | ORAL_CAPSULE | Freq: Every day | ORAL | Status: DC
  Administered 2018-10-14: 100 mg via ORAL
  Filled 2018-10-13 (×2): qty 1

## 2018-10-13 MED ORDER — SODIUM CHLORIDE 0.9% FLUSH: 3.0000 mL | INTRAVENOUS | Status: DC | PRN

## 2018-10-13 MED ORDER — SODIUM CHLORIDE 0.9 % IV SOLN: 250.0000 mL | INTRAVENOUS | Status: DC | PRN

## 2018-10-13 NOTE — H&P (Signed)
28 y.o. [redacted]w[redacted]d  G2P1001 comes in to office today for decreased FM.  No other c/o.  Pt stated that baby only moved a few times this am and this was different from yesterday.  Pt was placed on NST and it was reactive but pt was still not sure of adequate movement.  [place] for BPP was done and showed normal fluid.  However, fetus did not move during that time and had 2 off for breathing, movement and tone.  BPP final was 4/10.  The fetal heart on US exam also appeared to be skipping beats.  I elected to send patient over immediately for observation and NST and ordered an MFM consult.  The pt also received her first dose of BMZ.    Past Medical History:  Diagnosis Date  . Headache(784.0) HEADACHES AS TEEN NONE IN 3 YEARS  . Migraine     Past Surgical History:  Procedure Laterality Date  . ANKLE RECONSTRUCTION  10/03/2011   Procedure: RECONSTRUCTION ANKLE;  Surgeon: Toni Arthurs, MD;  Location: [facility];  Service: Orthopedics;  Laterality: Left;  left ankle modified brostrum lateral ligament reconstruction  . ROOT CANAL    . WISDOM TOOTH EXTRACTION      OB History  Gravida Para Term Preterm AB Living  2 1 1     1   SAB TAB Ectopic Multiple Live Births        0 1    # Outcome Date GA Lbr Len/2nd Weight Sex Delivery Anes PTL Lv  2 Current           1 Term 12/17/14 [redacted]w[redacted]d 31:44 / 02:07 3189 g F Vag-Spont EPI  LIV    Social History   Socioeconomic History  . Marital status: Single    Spouse name: Not on file  . Number of children: 1  . Years of education: Not on file  . Highest education level: Not on file  Occupational History  . Occupation: Runner, broadcasting/film/video  Social Needs  . Financial resource strain: Not hard at all  . Food insecurity:    Worry: Not on file    Inability: Not on file  . Transportation needs:    Medical: Not on file    Non-medical: Not on file  Tobacco Use  . Smoking status: Never Smoker  . Smokeless tobacco: Never Used  Substance and Sexual Activity  . Alcohol  use: No  . Drug use: No  . Sexual activity: Yes    Comment: last IC 06/12/18  Lifestyle  . Physical activity:    Days per week: Not on file    Minutes per session: Not on file  . Stress: Not on file  Relationships  . Social connections:    Talks on phone: Not on file    Gets together: Not on file    Attends religious service: Not on file    Active member of club or organization: Not on file    Attends meetings of clubs or organizations: Not on file    Relationship status: Not on file  . Intimate partner violence:    Fear of current or ex partner: Not on file    Emotionally abused: Not on file    Physically abused: Not on file    Forced sexual activity: Not on file  Other Topics Concern  . Not on file  Social History Narrative   Lives with fiancee and daughter.     Teaches first grade.     Education: college.  Tape    Prenatal Transfer Tool  Maternal Diabetes: No Genetic Screening: Declined Maternal Ultrasounds/Referrals: Normal Fetal Ultrasounds or other Referrals:  None Maternal Substance Abuse:  No Significant Maternal Medications:  None Significant Maternal Lab Results: None except GBS in urine.  Other PNC: uncomplicated.    Vitals:   10/13/18 1300  BP: 125/83  Pulse: 99  Resp: 20  Temp: 98.6 F (37 C)  TempSrc: Oral  SpO2: 99%  Weight: 97.5 kg  Height: 5\' 9"  (1.753 m)    Lungs/Cor:  NAD Abdomen:  soft, gravid Ex:  no cords, erythema SVE:  Deferred at office. FHTs:  140s, good STV, NST R; Cat 1 tracing.  One mild variable seen.  Toco:  q occ   A/P   28 y.o. G2P1001 [redacted]w[redacted]d with decreased FM and BPP 4/10 at office.  Also possible irregular fetal HR.  GBS POS in urine. 1.  MFM consult and dopplers- pt had NST here without decels and has gone down for eval. 2.  BMZ #1 given. 3.  I understand that NICU may be full at this point but pt needed to be evaluated and confirmed fetal stability before sending out of town.  Once stability is confirmed and if  pt needs non urgent delivery and continued hospital care, will consider transfer.   Loney Laurence

## 2018-10-13 NOTE — Progress Notes (Addendum)
Pt still does not feel movement like she feels she should.  Vitals:   10/13/18 1300 10/13/18 1548  BP: 125/83 103/60  Pulse: 99 84  Resp: 20 20  Temp: 98.6 F (37 C) 98.3 F (36.8 C)  TempSrc: Oral Oral  SpO2: 99% 99%  Weight: 97.5 kg   Height: 5\' 9"  (1.753 m)     FHTs currently 130s, gSTV, NST R, cat 1 Toco occ  MFM Report: Impression  Patient was admitted following decreased fetal movements  and poor BPP at your office (4/10 with reassuring NST and  normal amniotic fluid). She reports no chronic medical  conditions.  On ultrasound, fetal growth is appropriate for gestational age.  Amniotic fluid is normal and good fetal activity is seen. Fetal  anatomy appears normal but limited by advanced gestational  age. Antenatal testing is reassuring. BPP 8/8. Umbilical artery  Doppler showed normal forward diastolic flow. Middle-  cerebral artery Doppler (performed to rule out fetal anemia  from fetomaternal hemorrhage) peak-systolic velocity  measurement is within normal limits (no evidence of fetal  anemia).  Occasional fetal arrhythmia was seen that is consistent with  premature atrial contractions (benign).  I reviewed the NST that is reassuring (reactive for this  gestational age). Patient informed our sonographer that she  felt good fetal movements.  This ultrasound was remotely read.  Discussed with Dr. Henderson Cloud. ---------------------------------------------------------------------- Recommendations  -Given that antenatal testing is reassuring, consider  discharge after 2 to 3 hours of reassuring NST. ----------------------------------------------------------------------                  Noralee Space, MD  A/p Pt not feeling movement still but desires to go home.  Will have pt eat and drink again and do kick counts- if gets kick counts will d/c. Dopplers were normal.Uncertain why BPP so low in office but will d/c to home with kick counts and RTC for recheck on Thursday.  Pt  should come back to MAU for second BMZ shot tomorrow.

## 2018-10-14 LAB — ABO/RH: ABO/RH(D): B POS

## 2018-10-14 NOTE — Progress Notes (Signed)
HD#2, late entry from this am Pt reports good FM and now feeling more reassured. Reviewed strip since arrival, Cat 1, rare and brief decel noted.  Pt not reporting contractions, bleeding LOF or other problems. Reviewed FKC precautions. Pt will remain until 2nd dose of betamethasone administered, then discharge home and f/u in office as scheduled.

## 2018-10-14 NOTE — Progress Notes (Signed)
Discharge instructions given to patient, discussed follow up appointment with patient. No medication changes at this time. RN reviewed fetal kick counts with patient.  Patient verbalized understanding of all of the above.

## 2018-10-14 NOTE — Progress Notes (Signed)
Pt was still not feeling movement adequately after eating and elected to stay for overnight obs.  FHTs now 120s, gSTV, NST R, cat 1 Toco rare  Would consider repeating BPP in am.

## 2018-10-14 NOTE — Discharge Summary (Signed)
Pt seen in office yesterday and reported decreased fetal movement.  NST was reactive, BPP ordered and found to be 4/10.  Pt sent to MAU for eval and MFM consult.  MFM repeated BPP, score 10/10, NST remained reactive and Cat 1.  MFM recommended discharge home after 2-3 hours of reassuring testing.  At that time, pt still concerned about decreased FM so kept overnight for additional monitoring.  As of this am, fetal movement normalized and after discussed pt felt comfortable with d/c home and FCK precautions.  Aware to call office if any concerns.

## 2018-12-01 ENCOUNTER — Other Ambulatory Visit: Payer: Self-pay

## 2018-12-01 ENCOUNTER — Encounter (HOSPITAL_COMMUNITY): Payer: Self-pay

## 2018-12-01 ENCOUNTER — Inpatient Hospital Stay (HOSPITAL_COMMUNITY)
Admission: AD | Admit: 2018-12-01 | Discharge: 2018-12-03 | DRG: 769 | Disposition: A | Discharge status: Home / Self Care | Payer: BC Managed Care – PPO | Attending: Obstetrics and Gynecology | Admitting: Obstetrics and Gynecology

## 2018-12-01 MED ORDER — WITCH HAZEL-GLYCERIN EX PADS: 1.0000 "application " | MEDICATED_PAD | CUTANEOUS | Status: DC | PRN

## 2018-12-01 MED ORDER — ZOLPIDEM TARTRATE 5 MG PO TABS: 5.0000 mg | ORAL_TABLET | Freq: Every evening | ORAL | Status: DC | PRN

## 2018-12-01 MED ORDER — DIBUCAINE (PERIANAL) 1 % EX OINT: 1.0000 "application " | TOPICAL_OINTMENT | CUTANEOUS | Status: DC | PRN

## 2018-12-01 MED ORDER — SENNOSIDES-DOCUSATE SODIUM 8.6-50 MG PO TABS
2.0000 | ORAL_TABLET | ORAL | Status: DC
  Administered 2018-12-01: 2 via ORAL
  Filled 2018-12-01 (×2): qty 2

## 2018-12-01 MED ORDER — ACETAMINOPHEN 325 MG PO TABS: 650.0000 mg | ORAL_TABLET | ORAL | Status: DC | PRN

## 2018-12-01 MED ORDER — ONDANSETRON HCL 4 MG PO TABS: 4.0000 mg | ORAL_TABLET | ORAL | Status: DC | PRN

## 2018-12-01 MED ORDER — SIMETHICONE 80 MG PO CHEW: 80.0000 mg | CHEWABLE_TABLET | ORAL | Status: DC | PRN

## 2018-12-01 MED ORDER — OXYCODONE-ACETAMINOPHEN 5-325 MG PO TABS: 2.0000 | ORAL_TABLET | ORAL | Status: DC | PRN

## 2018-12-01 MED ORDER — DIPHENHYDRAMINE HCL 25 MG PO CAPS: 25.0000 mg | ORAL_CAPSULE | Freq: Four times a day (QID) | ORAL | Status: DC | PRN

## 2018-12-01 MED ORDER — ONDANSETRON HCL 4 MG/2ML IJ SOLN: 4.0000 mg | INTRAMUSCULAR | Status: DC | PRN

## 2018-12-01 MED ORDER — IBUPROFEN 600 MG PO TABS
600.0000 mg | ORAL_TABLET | Freq: Four times a day (QID) | ORAL | Status: DC
  Administered 2018-12-01 – 2018-12-02 (×6): 600 mg via ORAL
  Filled 2018-12-01 (×8): qty 1

## 2018-12-01 MED ORDER — COCONUT OIL OIL
1.0000 "application " | TOPICAL_OIL | Status: DC | PRN
  Administered 2018-12-03: 1 via TOPICAL

## 2018-12-01 MED ORDER — LIDOCAINE HCL (PF) 1 % IJ SOLN
INTRAMUSCULAR | Status: AC
  Administered 2018-12-01: 30 mL
  Filled 2018-12-01: qty 30

## 2018-12-01 MED ORDER — BENZOCAINE-MENTHOL 20-0.5 % EX AERO
1.0000 "application " | INHALATION_SPRAY | CUTANEOUS | Status: DC | PRN
  Filled 2018-12-01: qty 56

## 2018-12-01 MED ORDER — PRENATAL MULTIVITAMIN CH
1.0000 | ORAL_TABLET | Freq: Every day | ORAL | Status: DC
  Administered 2018-12-01 – 2018-12-02 (×2): 1 via ORAL
  Filled 2018-12-01 (×2): qty 1

## 2018-12-01 MED ORDER — TETANUS-DIPHTH-ACELL PERTUSSIS 5-2.5-18.5 LF-MCG/0.5 IM SUSP: 0.5000 mL | Freq: Once | INTRAMUSCULAR | Status: DC

## 2018-12-01 MED ORDER — OXYCODONE-ACETAMINOPHEN 5-325 MG PO TABS
1.0000 | ORAL_TABLET | ORAL | Status: DC | PRN
  Administered 2018-12-01: 1 via ORAL
  Filled 2018-12-01: qty 1

## 2018-12-01 NOTE — Progress Notes (Addendum)
Pt began having contractions last night around 11 PM. Pt delivered at 0937 this morning at home with Fire department present for delivery. Mom and baby arrived via EMS in stable condition. Baby did not have cord clamp on upon arrival to hospital. Cord clamp placed immediately. MD Claiborne Billings notified of patient. Pt up to bathroom to empty bladder. Fundus firm with massage. Bleeding scant to small. Vital signs, weights and measures performed on baby and then baby placed back skin to skin with mom.

## 2018-12-01 NOTE — Lactation Note (Signed)
This note was copied from a baby's chart. Lactation Consultation Note  Patient Name: Michelle Ortiz Date: 12/01/2018 Reason for consult: Initial assessment;Other (Comment);Early term 37-38.6wks(home delivery)  5 hours old early term female who is being exclusively BF by his mother, she's a P2 and experienced BF. She was able to BF her first child for 3 months but experienced some BF difficulties because her baby was lip tie. She had a home delivery but baby didn't get to have a good latch/feeding until they came to the hospital. Mom has a DEBP coming in the mail but she told LC is not coming until next week, and she wanted to "switch" her request to get one through [facility], mom is not a [employer]. Explained to mom we don't issue DEBP to take home to [facility] employees but that she can get a hand pump from [place]. Mom asked if there is a charge for the hand pump and LC told her that is part of her [facility] consultation, there is no separate charge. Mom agreed and requested to get one, instructions, cleaning and storage were reviewed, as well as milk storage guidelines.   Offered assistance with latch but mom politely declined, she said baby has had a pretty long feeding and that he was sound asleep. Asked mom to call for assistance when needed. Per mom feedings at the breast are comfortable and she can hear baby swallowing when he's at the breast. She's also familiar with hand expression and has seen colostrum when doing so when the help of her RN. Reviewed normal newborn behavior, cluster feeding and feeding cues.  Feeding plan:  1. Encouraged mom to feed baby STS 8-12 times/24 hours or sooner if feeding cues are present 2. Hand expression and spoon feeding was also encouraged  BF brochure, BF resources and feeding diary were reviewed. Parents reported all questions and concerns were answered, they're both aware of LC OP services and will call PRN.   Maternal Data Formula Feeding for  Exclusion: Yes Reason for exclusion: Mother's choice to formula and breast feed on admission Has patient been taught Hand Expression?: Yes Does the patient have breastfeeding experience prior to this delivery?: Yes  Feeding Feeding Type: Breast Fed  LATCH Score Latch: Grasps breast easily, tongue down, lips flanged, rhythmical sucking.  Audible Swallowing: A few with stimulation  Type of Nipple: Everted at rest and after stimulation  Comfort (Breast/Nipple): Soft / non-tender  Hold (Positioning): No assistance needed to correctly position infant at breast.  LATCH Score: 9  Interventions Interventions: Breast feeding basics reviewed;Hand pump  Lactation Tools Discussed/Used Tools: Pump Breast pump type: Manual WIC Program: No Pump Review: Setup, frequency, and cleaning;Milk Storage Initiated by:: MPeck Date initiated:: 12/01/18   Consult Status Consult Status: Follow-up Date: 12/02/18 Follow-up type: In-patient    Michelle Ortiz 12/01/2018, 3:22 PM

## 2018-12-01 NOTE — H&P (Signed)
28 y.o. 5151w6d  G2P1001 comes by ambulance after post delivery at home.  Otherwise has good fetal movement and no bleeding.  Past Medical History:  Diagnosis Date  . Headache(784.0) HEADACHES AS TEEN NONE IN 3 YEARS  . Migraine     Past Surgical History:  Procedure Laterality Date  . ANKLE RECONSTRUCTION  10/03/2011   Procedure: RECONSTRUCTION ANKLE;  Surgeon: Toni ArthursJohn Hewitt, MD;  Location: [facility];  Service: Orthopedics;  Laterality: Left;  left ankle modified brostrum lateral ligament reconstruction  . ROOT CANAL    . WISDOM TOOTH EXTRACTION      OB History  Gravida Para Term Preterm AB Living  2 1 1     1   SAB TAB Ectopic Multiple Live Births        0 1    # Outcome Date GA Lbr Len/2nd Weight Sex Delivery Anes PTL Lv  2 Current           1 Term 12/17/14 5868w5d 31:44 / 02:07 3189 g F Vag-Spont EPI  LIV    Social History   Socioeconomic History  . Marital status: Single    Spouse name: Not on file  . Number of children: 1  . Years of education: Not on file  . Highest education level: Not on file  Occupational History  . Occupation: Runner, broadcasting/film/videoteacher  Social Needs  . Financial resource strain: Not hard at all  . Food insecurity:    Worry: Not on file    Inability: Not on file  . Transportation needs:    Medical: Not on file    Non-medical: Not on file  Tobacco Use  . Smoking status: Never Smoker  . Smokeless tobacco: Never Used  Substance and Sexual Activity  . Alcohol use: No  . Drug use: No  . Sexual activity: Yes    Comment: last IC 06/12/18  Lifestyle  . Physical activity:    Days per week: Not on file    Minutes per session: Not on file  . Stress: Not on file  Relationships  . Social connections:    Talks on phone: Not on file    Gets together: Not on file    Attends religious service: Not on file    Active member of club or organization: Not on file    Attends meetings of clubs or organizations: Not on file    Relationship status: Not on file  .  Intimate partner violence:    Fear of current or ex partner: Not on file    Emotionally abused: Not on file    Physically abused: Not on file    Forced sexual activity: Not on file  Other Topics Concern  . Not on file  Social History Narrative   Lives with fiancee and daughter.     Teaches first grade.     Education: college.   Tape    Prenatal Transfer Tool  Maternal Diabetes: No Genetic Screening: Declined Maternal Ultrasounds/Referrals: Normal Fetal Ultrasounds or other Referrals:  None, fetal arrhythmia monitored and deemed normal Maternal Substance Abuse:  No Significant Maternal Medications:  None Significant Maternal Lab Results: Lab values include: Group B Strep positive, did not receive antibiotics before delivery, delivered at home.  Other PNC: uncomplicated but mild anemia    Vitals:   12/01/18 1046 12/01/18 1106 12/01/18 1115  BP: 112/63  129/71  Pulse: 75  81  Resp: 16 16 16   Temp: 98.7 F (37.1 C)    TempSrc: Oral  Weight: 102.1 kg    Height: 5\' 9"  (1.753 m)      Lungs/Cor:  NAD Abdomen:  soft, gravid Ex:  no cords, erythema Delivered   A/P  Admitted post home delivery Mom and baby doing fine  GBS Pos- no abx prior to delivery  Philip Aspen

## 2018-12-02 LAB — CBC
HCT: 30.6 % — ABNORMAL LOW (ref 36.0–46.0)
Hemoglobin: 9.8 g/dL — ABNORMAL LOW (ref 12.0–15.0)
MCH: 26.1 pg (ref 26.0–34.0)
MCHC: 32 g/dL (ref 30.0–36.0)
MCV: 81.4 fL (ref 80.0–100.0)
Platelets: 172 10*3/uL (ref 150–400)
RBC: 3.76 MIL/uL — ABNORMAL LOW (ref 3.87–5.11)
RDW: 15.5 % (ref 11.5–15.5)
WBC: 12.6 10*3/uL — ABNORMAL HIGH (ref 4.0–10.5)
nRBC: 0 % (ref 0.0–0.2)

## 2018-12-02 LAB — RPR: RPR Ser Ql: NONREACTIVE

## 2018-12-02 NOTE — Progress Notes (Deleted)
Pt's record from four years ago states that her Rubella is Non-Immune. RN spoke to pt about receiving the vaccination and why it is important and pt consented. MD called and states that he will put in the orders for the MMR vaccination.

## 2018-12-03 MED ORDER — IBUPROFEN 600 MG PO TABS: 600.0000 mg | ORAL_TABLET | Freq: Four times a day (QID) | ORAL | 0 refills | Status: DC | PRN

## 2018-12-03 MED ORDER — DOCUSATE SODIUM 100 MG PO CAPS: 100.0000 mg | ORAL_CAPSULE | Freq: Two times a day (BID) | ORAL | 0 refills | Status: DC

## 2018-12-03 NOTE — Discharge Instructions (Signed)

## 2018-12-03 NOTE — Discharge Summary (Signed)
Obstetric Discharge Summary Reason for Admission: Home vaginal delivery Prenatal Procedures: ultrasound Intrapartum Procedures: postpartum care Postpartum Procedures: none Complications-Operative and Postpartum: 2nd degree laceration degree perineal laceration Hemoglobin  Date Value Ref Range Status  12/02/2018 9.8 (L) 12.0 - 15.0 g/dL Final   HCT  Date Value Ref Range Status  12/02/2018 30.6 (L) 36.0 - 46.0 % Final    Physical Exam:  General: alert, cooperative and appears stated age 9: appropriate Uterine Fundus: firm DVT Evaluation: No evidence of DVT seen on physical exam.  Discharge Diagnoses: Term Pregnancy-delivered  Discharge Information: Date: 12/03/2018 Activity: pelvic rest Diet: routine Medications: Ibuprofen and Colace Condition: improved Instructions: refer to practice specific booklet Discharge to: home Follow-up Information    Levi Aland, MD Follow up in 4 week(s).   Specialty:  Obstetrics and Gynecology Why:  For a postpartum evaluation Contact information: [address] [place] [address] [phone]           Newborn Data: Live born female  Birth Weight: 7 lb 8 oz (3402 g) APGAR: ,   Newborn Delivery   Birth date/time:  12/01/2018 09:37:00 Delivery type:  Vaginal, Spontaneous     Home with mother.  Waynard Reeds 12/03/2018, 10:54 AM

## 2019-01-28 ENCOUNTER — Telehealth (HOSPITAL_COMMUNITY): Payer: Self-pay | Admitting: Lactation Services

## 2019-01-28 NOTE — Telephone Encounter (Signed)
Called and spoke with mom in regards to pumping. She reports she is still having the discomfort.   Discussed with her that I reached out to a professional Lactation website and received several recommendations to try a smaller flange and to try the [place] for pumping. Mom reports she has hears of those and will research.   Pt to call with further questions/concerns as needed.

## 2020-07-12 ENCOUNTER — Telehealth: Payer: Self-pay | Admitting: Nurse Practitioner

## 2020-07-12 ENCOUNTER — Encounter: Payer: Self-pay | Admitting: Nurse Practitioner

## 2020-07-12 NOTE — Telephone Encounter (Signed)
COVID [facility] Contact Note   Attempted to contact patient, Michelle Ortiz, via telephone to discuss symptoms and evaluate for qualifications and interest in [facility] Infusion treatment for COVID-19.   Chart review does not reveal the presence of high risk qualifiers based on the FDA Emergency Use Authorization, however, unable to verify with patient at this time.   Unable to reach patient by telephone. Voicemail left with reason for call and call back information for [facility] hotline at [phone].    Shawna Clamp, DNP, AGNP-c COVID-19 [facility] [phone]

## 2020-11-26 IMAGING — US US MFM UA CORD DOPPLER
1 series · 13 of 28 positions shown · non-contrast
Comparison: none

[Series 1: us mfm ua cord doppler · 94 acquisitions, 13 frames shown]
[im 4/94]
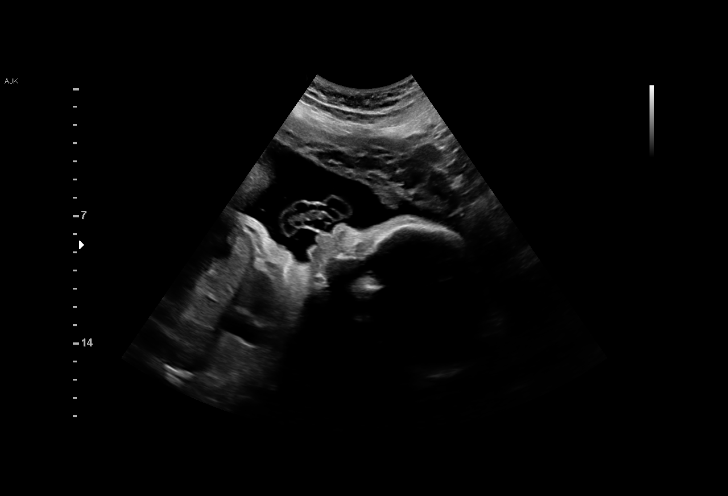
[im 11/94]
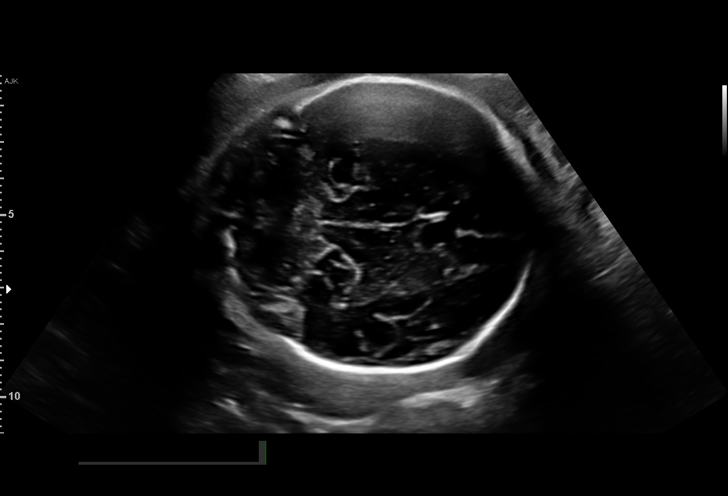
[im 18/94]
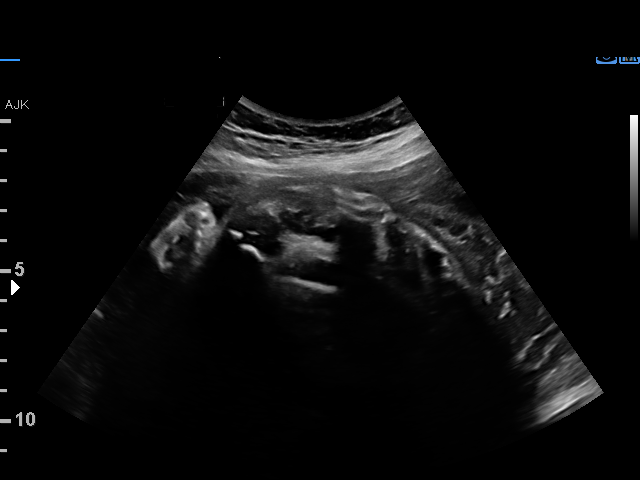
[im 25/94]
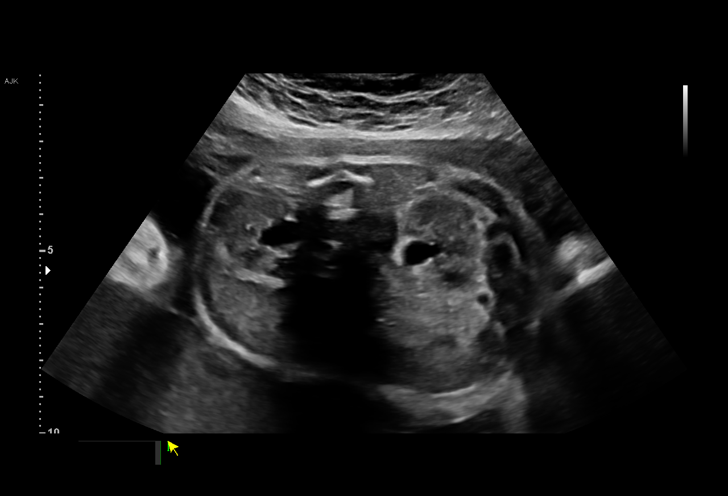
[im 32/94]
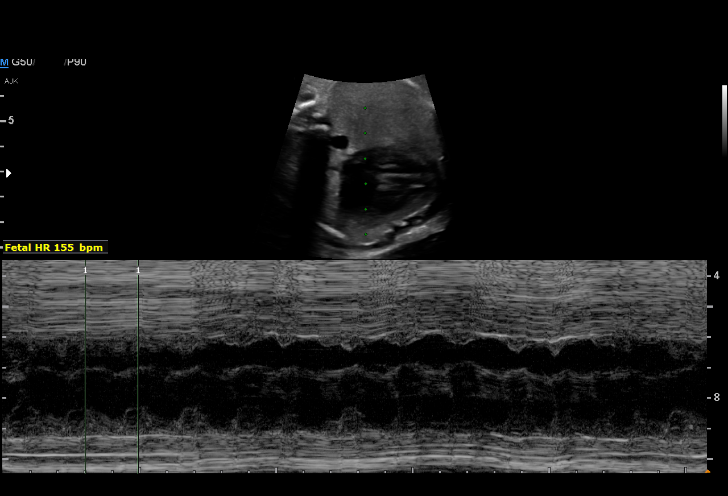
[im 38/94]
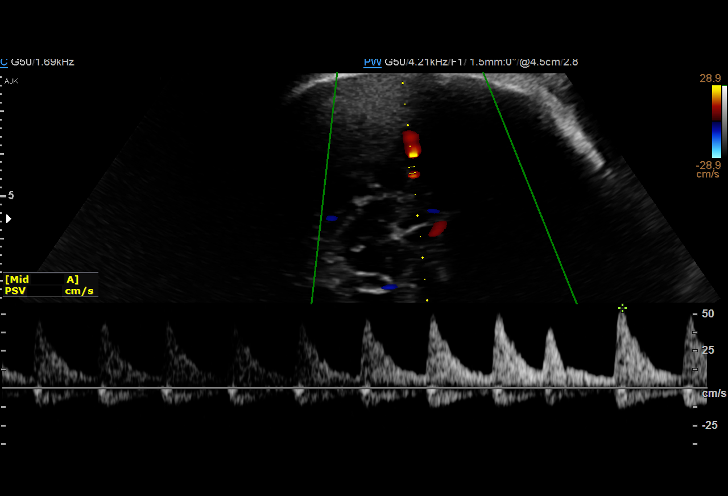
[im 49/94]
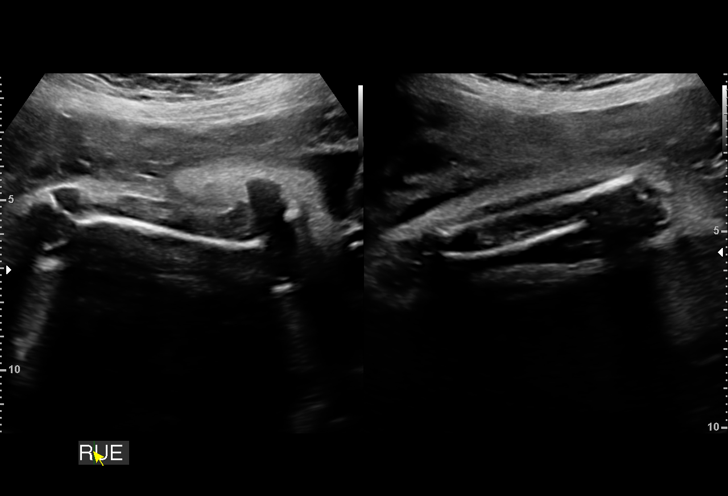
[im 56/94]
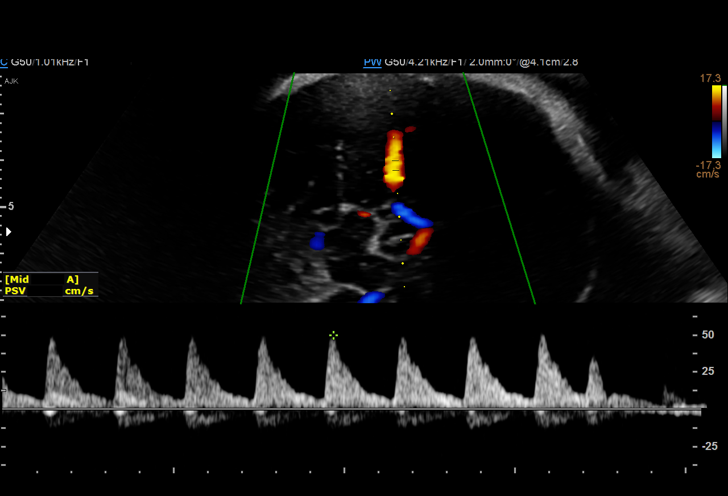
[im 63/94]
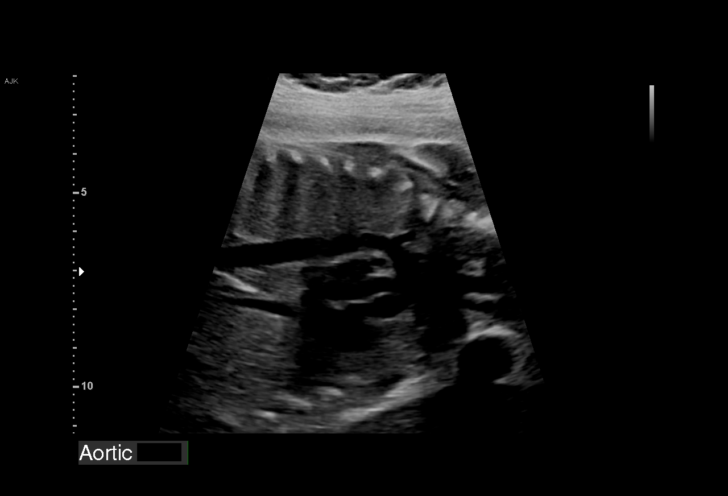
[im 69/94]
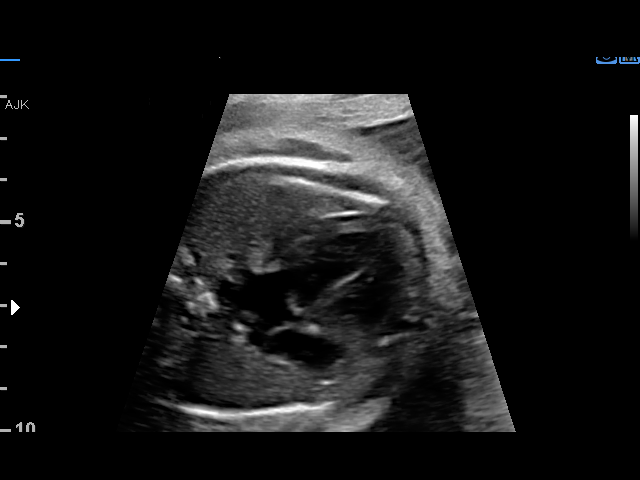
[im 76/94]
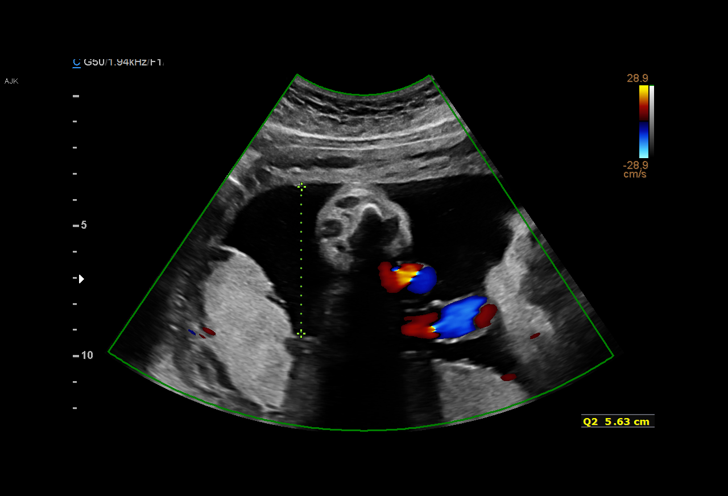
[im 83/94]
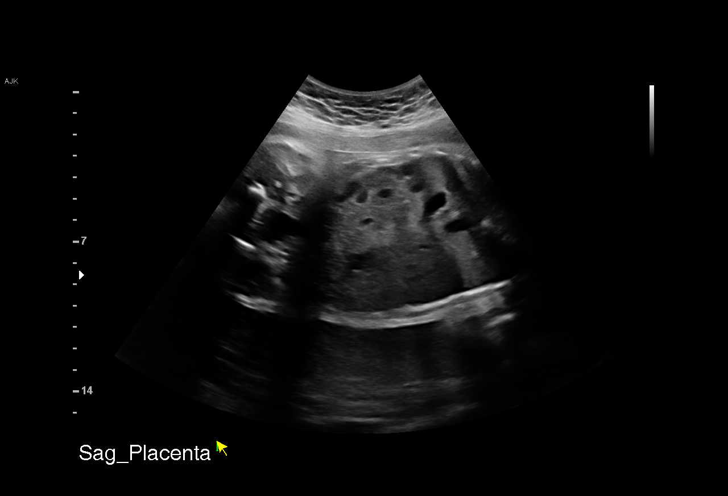
[im 90/94]
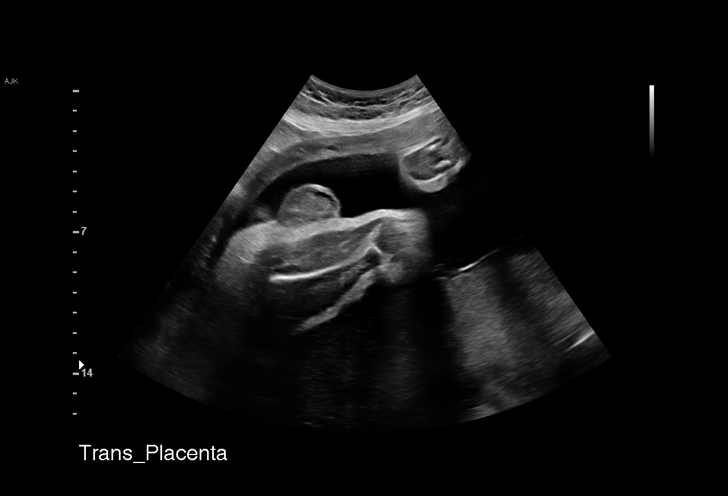

[13 of 28 positions shown; findings below may reference images not displayed]

1  US MFM MCA DOPPLER                   76821.01     [facility]
  3  US MFM UA CORD DOPPLER               76820.02     [facility]
 ----------------------------------------------------------------------

 ----------------------------------------------------------------------
Indications

  31 weeks gestation of pregnancy
  Abnormal fetal heart rate                      O76
  Decreased fetal movement
  Encounter for antenatal screening for
  malformations
  Obesity complicating pregnancy, third
  trimester
 ----------------------------------------------------------------------
Vital Signs

 BMI:
Fetal Evaluation

 Num Of Fetuses:         1
 Fetal Heart Rate(bpm):  151
 Cardiac Activity:       Observed
 Presentation:           Cephalic
 Placenta:               Posterior
 P. Cord Insertion:      Visualized
 Amniotic Fluid
 AFI FV:      Within normal limits

 AFI Sum(cm)     %Tile       Largest Pocket(cm)
 12.72           37

 RUQ(cm)       RLQ(cm)       LUQ(cm)        LLQ(cm)

Biophysical Evaluation

 Amniotic F.V:   Within normal limits       F. Tone:        Observed
 F. Movement:    Observed                   Score:          [DATE]
 F. Breathing:   Observed
Biometry

 BPD:      80.1  mm     G. Age:  32w 1d         50  %    CI:        78.82   %    70 - 86
                                                         FL/HC:      21.1   %    19.1 -
 HC:      285.3  mm     G. Age:  31w 2d          8  %    HC/AC:      0.97        0.96 -
 AC:      295.4  mm     G. Age:  33w 4d         89  %    FL/BPD:     75.2   %    71 - 87
 FL:       60.2  mm     G. Age:  31w 2d         23  %    FL/AC:      20.4   %    20 - 24
 LV:        2.6  mm

 Est. FW:    1229  gm      4 lb 6 oz     69  %
OB History

 Gravidity:    2         Term:   1
Gestational Age

 LMP:           31w 6d        Date:  03/04/18                 EDD:   12/09/18
 U/S Today:     32w 1d                                        EDD:   12/07/18
 Best:          31w 6d     Det. By:  LMP  (03/04/18)          EDD:   12/09/18
Anatomy

 Cranium:               Appears normal         Aortic Arch:            Appears normal
 Cavum:                 Appears normal         Ductal Arch:            Not well visualized
 Ventricles:            Appears normal         Diaphragm:              Appears normal
 Choroid Plexus:        Appears normal         Stomach:                Appears normal, left
                                                                       sided
 Cerebellum:            Appears normal         Abdomen:                Appears normal
 Posterior Fossa:       Appears normal         Abdominal Wall:         Appears nml (cord
                                                                       insert, abd wall)
 Nuchal Fold:           Not applicable (>20    Cord Vessels:           Appears normal (3
                        wks GA)                                        vessel cord)
 Face:                  Orbits appear          Kidneys:                Appear normal
                        normal
 Lips:                  Appears normal         Bladder:                Appears normal
 Thoracic:              Appears normal         Spine:                  Appears normal
 Heart:                 Appears normal         Upper Extremities:      Appears normal
                        (4CH, axis, and
                        situs)
 RVOT:                  Appears normal         Lower Extremities:      Appears normal
 LVOT:                  Appears normal

 Other:  Fetus appears to be a male. Nasal bone visualized.
Doppler - Fetal Vessels

 Umbilical Artery
  S/D     %tile     RI              PI                     ADFV    RDFV
 2.46       37   0.59             0.83                        No      No

 Middle Cerebral Artery
  S/D               RI              PI    %tile     PSV    MoM
                                                  (cm/s)
 1.01            0.01             0.83    <

 Cerebroplacental Ratio
                        MCA PI / UA PI    %tile
                                     1    <

Impression

 Patient was admitted following decreased fetal movements
 and poor BPP at your office ([DATE] with reassuring NST and
 normal amniotic fluid). She reports no chronic medical
 conditions.
 On ultrasound, fetal growth is appropriate for gestational age.
 Amniotic fluid is normal and good fetal activity is seen. Fetal
 anatomy appears normal but limited by advanced gestational
 age. Antenatal testing is reassuring. BPP [DATE]. Umbilical artery
 Doppler showed normal forward diastolic flow. Middle-
 cerebral artery Doppler (performed to rule out fetal anemia
 from fetomaternal hemorrhage) peak-systolic velocity
 measurement is within normal limits (no evidence of fetal
 anemia).
 Occasional fetal arrhythmia was seen that is consistent with
 premature atrial contractions (benign).
 I reviewed the NST that is reassuring (reactive for this
 gestational age). Patient informed our sonographer that she
 felt good fetal movements.
 This ultrasound was remotely read.
 Discussed with Dr. Rocks.
Recommendations

 -Given that antenatal testing is reassuring, consider
 discharge after 2 to 3 hours of reassuring NST.
                 Melo Quispe, Richard William

## 2021-06-27 ENCOUNTER — Inpatient Hospital Stay (HOSPITAL_COMMUNITY)
Admission: AD | Admit: 2021-06-27 | Discharge: 2021-06-27 | Disposition: A | Discharge status: Home / Self Care | Payer: BC Managed Care – PPO | Attending: Obstetrics and Gynecology | Admitting: Obstetrics and Gynecology

## 2021-06-27 ENCOUNTER — Inpatient Hospital Stay (HOSPITAL_COMMUNITY): Payer: BC Managed Care – PPO

## 2021-06-27 ENCOUNTER — Encounter (HOSPITAL_COMMUNITY): Payer: Self-pay | Admitting: Obstetrics and Gynecology

## 2021-06-27 ENCOUNTER — Other Ambulatory Visit: Payer: Self-pay

## 2021-06-27 DIAGNOSIS — O219 Vomiting of pregnancy, unspecified: Secondary | ICD-10-CM | POA: Diagnosis present

## 2021-06-27 DIAGNOSIS — O26899 Other specified pregnancy related conditions, unspecified trimester: Secondary | ICD-10-CM

## 2021-06-27 DIAGNOSIS — R109 Unspecified abdominal pain: Secondary | ICD-10-CM

## 2021-06-27 DIAGNOSIS — O26891 Other specified pregnancy related conditions, first trimester: Secondary | ICD-10-CM | POA: Diagnosis not present

## 2021-06-27 DIAGNOSIS — Z3A01 Less than 8 weeks gestation of pregnancy: Secondary | ICD-10-CM

## 2021-06-27 DIAGNOSIS — Z3491 Encounter for supervision of normal pregnancy, unspecified, first trimester: Secondary | ICD-10-CM

## 2021-06-27 LAB — POCT PREGNANCY, URINE: Preg Test, Ur: POSITIVE — AB

## 2021-06-27 LAB — CBC WITH DIFFERENTIAL/PLATELET
Abs Immature Granulocytes: 0.03 10*3/uL (ref 0.00–0.07)
Basophils Absolute: 0 10*3/uL (ref 0.0–0.1)
Basophils Relative: 0 %
Eosinophils Absolute: 0 10*3/uL (ref 0.0–0.5)
Eosinophils Relative: 0 %
HCT: 41.2 % (ref 36.0–46.0)
Hemoglobin: 13.8 g/dL (ref 12.0–15.0)
Immature Granulocytes: 0 %
Lymphocytes Relative: 10 %
Lymphs Abs: 1.1 10*3/uL (ref 0.7–4.0)
MCH: 29 pg (ref 26.0–34.0)
MCHC: 33.5 g/dL (ref 30.0–36.0)
MCV: 86.6 fL (ref 80.0–100.0)
Monocytes Absolute: 0.4 10*3/uL (ref 0.1–1.0)
Monocytes Relative: 4 %
Neutro Abs: 9.2 10*3/uL — ABNORMAL HIGH (ref 1.7–7.7)
Neutrophils Relative %: 86 %
Platelets: 299 10*3/uL (ref 150–400)
RBC: 4.76 MIL/uL (ref 3.87–5.11)
RDW: 12.7 % (ref 11.5–15.5)
WBC: 10.7 10*3/uL — ABNORMAL HIGH (ref 4.0–10.5)
nRBC: 0 % (ref 0.0–0.2)

## 2021-06-27 LAB — COMPREHENSIVE METABOLIC PANEL
ALT: 19 U/L (ref 0–44)
AST: 19 U/L (ref 15–41)
Albumin: 3.7 g/dL (ref 3.5–5.0)
Alkaline Phosphatase: 50 U/L (ref 38–126)
Anion gap: 9 (ref 5–15)
BUN: 7 mg/dL (ref 6–20)
CO2: 22 mmol/L (ref 22–32)
Calcium: 8.7 mg/dL — ABNORMAL LOW (ref 8.9–10.3)
Chloride: 104 mmol/L (ref 98–111)
Creatinine, Ser: 0.68 mg/dL (ref 0.44–1.00)
GFR, Estimated: >60 mL/min (ref >60)
Glucose, Bld: 93 mg/dL (ref 70–99)
Potassium: 3.5 mmol/L (ref 3.5–5.1)
Sodium: 135 mmol/L (ref 135–145)
Total Bilirubin: 0.5 mg/dL (ref 0.3–1.2)
Total Protein: 6.5 g/dL (ref 6.5–8.1)

## 2021-06-27 LAB — URINALYSIS, ROUTINE W REFLEX MICROSCOPIC
Bilirubin Urine: NEGATIVE
Glucose, UA: NEGATIVE mg/dL
Hgb urine dipstick: NEGATIVE
Ketones, ur: 40 mg/dL — AB
Leukocytes,Ua: NEGATIVE
Nitrite: NEGATIVE
Protein, ur: NEGATIVE mg/dL
Specific Gravity, Urine: 1.02 (ref 1.005–1.030)
pH: 7 (ref 5.0–8.0)

## 2021-06-27 LAB — HCG, QUANTITATIVE, PREGNANCY: hCG, Beta Chain, Quant, S: 32840 m[IU]/mL — ABNORMAL HIGH (ref <5)

## 2021-06-27 MED ORDER — METOCLOPRAMIDE HCL 10 MG PO TABS: 10.0000 mg | ORAL_TABLET | Freq: Three times a day (TID) | ORAL | 0 refills | Status: DC | PRN

## 2021-06-27 MED ORDER — FAMOTIDINE IN NACL 20-0.9 MG/50ML-% IV SOLN
20.0000 mg | Freq: Once | INTRAVENOUS | Status: AC
  Administered 2021-06-27: 20 mg via INTRAVENOUS
  Filled 2021-06-27: qty 50

## 2021-06-27 MED ORDER — PROMETHAZINE HCL 12.5 MG PO TABS: 12.5000 mg | ORAL_TABLET | Freq: Every evening | ORAL | 0 refills | Status: DC | PRN

## 2021-06-27 MED ORDER — SODIUM CHLORIDE 0.9 % IV SOLN
25.0000 mg | Freq: Once | INTRAVENOUS | Status: AC
  Administered 2021-06-27: 25 mg via INTRAVENOUS
  Filled 2021-06-27: qty 1

## 2021-06-27 MED ORDER — LACTATED RINGERS IV BOLUS
1000.0000 mL | Freq: Once | INTRAVENOUS | Status: AC
  Administered 2021-06-27: 1000 mL via INTRAVENOUS

## 2021-06-27 MED ORDER — SODIUM CHLORIDE 0.9 % IV SOLN: 25.0000 mg | Freq: Once | INTRAVENOUS | Status: DC

## 2021-06-27 NOTE — MAU Note (Signed)
Started throwing up about 5 days ago.  Today it was really bad.  Continued to throw up, brought on a migraine. +HPT on the 20th, needs confirmation. Feeling pressure at her belly button and cramping, some low back pain from heaving all day.  Throat is hurting from all the acid. No bleeding.

## 2021-06-27 NOTE — MAU Provider Note (Addendum)
History     CSN: [id number]  Arrival date and time: 06/27/21 1835   Event Date/Time   First Provider Initiated Contact with Patient 06/27/21 1928      Chief Complaint  Patient presents with   Emesis   Possible Pregnancy   Abdominal Pain   HPI  [name] is a 30 y.o. female G3P2002 @ [redacted]w[redacted]d here in MAU with N/V and umbilical pain. She reports the vomiting started 5 days ago. Reports not keeping anything down in the last 24 hours. She has tried water and Gatorade, and crackers; she vomited all of this up. The last time she vomited was 1830 tonight. She has not tried any nausea medication. She reports pain in the umbilicus that radiates down the middle of her abdomen. The pain comes and goes. She reports a pressure feeling type pain. She has no bleeding.   OB History     Gravida  3   Para  2   Term  2   Preterm      AB      Living  2      SAB      IAB      Ectopic      Multiple  0   Live Births  2           Past Medical History:  Diagnosis Date   Headache(784.0) HEADACHES AS TEEN NONE IN 3 YEARS   Migraine     Past Surgical History:  Procedure Laterality Date   ANKLE RECONSTRUCTION  10/03/2011   Procedure: RECONSTRUCTION ANKLE;  Surgeon: Toni Arthurs, MD;  Location: [facility];  Service: Orthopedics;  Laterality: Left;  left ankle modified brostrum lateral ligament reconstruction   ROOT CANAL     WISDOM TOOTH EXTRACTION      Family History  Problem Relation Age of Onset   Hypertension Mother    Thyroid disease Mother    Hypertension Father    Thyroid disease Sister     Social History   Tobacco Use   Smoking status: Never   Smokeless tobacco: Never  Vaping Use   Vaping Use: Never used  Substance Use Topics   Alcohol use: No   Drug use: No    Allergies:  Allergies  Allergen Reactions   Tape Rash    Adhesive     Medications Prior to Admission  Medication Sig Dispense Refill Last Dose   docusate sodium (COLACE)  100 MG capsule Take 1 capsule (100 mg total) by mouth 2 (two) times daily. 60 capsule 0    ibuprofen (ADVIL) 600 MG tablet Take 1 tablet (600 mg total) by mouth every 6 (six) hours as needed. 90 tablet 0    Results for orders placed or performed during the hospital encounter of 06/27/21 (from the past 48 hour(s))  Urinalysis, Routine w reflex microscopic Urine, Clean Catch     Status: Abnormal   Collection Time: 06/27/21  6:35 PM  Result Value Ref Range   Color, Urine YELLOW YELLOW   APPearance CLEAR CLEAR   Specific Gravity, Urine 1.020 1.005 - 1.030   pH 7.0 5.0 - 8.0   Glucose, UA NEGATIVE NEGATIVE mg/dL   Hgb urine dipstick NEGATIVE NEGATIVE   Bilirubin Urine NEGATIVE NEGATIVE   Ketones, ur 40 (A) NEGATIVE mg/dL   Protein, ur NEGATIVE NEGATIVE mg/dL   Nitrite NEGATIVE NEGATIVE   Leukocytes,Ua NEGATIVE NEGATIVE    Comment: Microscopic not done on urines with negative protein, blood, leukocytes, nitrite, or  glucose < 500 mg/dL. Performed at [facility], [address], [place], [place] [address]   Pregnancy, urine POC     Status: Abnormal   Collection Time: 06/27/21  6:46 PM  Result Value Ref Range   Preg Test, Ur POSITIVE (A) NEGATIVE    Comment:        THE SENSITIVITY OF THIS METHODOLOGY IS >24 mIU/mL   CBC with Differential/Platelet     Status: Abnormal   Collection Time: 06/27/21  8:01 PM  Result Value Ref Range   WBC 10.7 (H) 4.0 - 10.5 K/uL   RBC 4.76 3.87 - 5.11 MIL/uL   Hemoglobin 13.8 12.0 - 15.0 g/dL   HCT 60.4 54.0 - 98.1 %   MCV 86.6 80.0 - 100.0 fL   MCH 29.0 26.0 - 34.0 pg   MCHC 33.5 30.0 - 36.0 g/dL   RDW 19.1 47.8 - 29.5 %   Platelets 299 150 - 400 K/uL   nRBC 0.0 0.0 - 0.2 %   Neutrophils Relative % 86 %   Neutro Abs 9.2 (H) 1.7 - 7.7 K/uL   Lymphocytes Relative 10 %   Lymphs Abs 1.1 0.7 - 4.0 K/uL   Monocytes Relative 4 %   Monocytes Absolute 0.4 0.1 - 1.0 K/uL   Eosinophils Relative 0 %   Eosinophils Absolute 0.0 0.0 - 0.5 K/uL    Basophils Relative 0 %   Basophils Absolute 0.0 0.0 - 0.1 K/uL   Immature Granulocytes 0 %   Abs Immature Granulocytes 0.03 0.00 - 0.07 K/uL    Comment: Performed at [facility], [address] [place], [place] [address]  Comprehensive metabolic panel     Status: Abnormal   Collection Time: 06/27/21  8:01 PM  Result Value Ref Range   Sodium 135 135 - 145 mmol/L   Potassium 3.5 3.5 - 5.1 mmol/L   Chloride 104 98 - 111 mmol/L   CO2 22 22 - 32 mmol/L   Glucose, Bld 93 70 - 99 mg/dL    Comment: Glucose reference range applies only to samples taken after fasting for at least 8 hours.   BUN 7 6 - 20 mg/dL   Creatinine, Ser 8.65 0.44 - 1.00 mg/dL   Calcium 8.7 (L) 8.9 - 10.3 mg/dL   Total Protein 6.5 6.5 - 8.1 g/dL   Albumin 3.7 3.5 - 5.0 g/dL   AST 19 15 - 41 U/L   ALT 19 0 - 44 U/L   Alkaline Phosphatase 50 38 - 126 U/L   Total Bilirubin 0.5 0.3 - 1.2 mg/dL   GFR, Estimated >78 >46 mL/min    Comment: (NOTE) Calculated using the CKD-EPI Creatinine Equation (2021)    Anion gap 9 5 - 15    Comment: Performed at [facility], [address], [place], [place] [address]  ABO/Rh     Status: None   Collection Time: 06/27/21  8:01 PM  Result Value Ref Range   ABO/RH(D)      B POS Performed at [facility], [address], [place], [place] [address]   hCG, quantitative, pregnancy     Status: Abnormal   Collection Time: 06/27/21  8:01 PM  Result Value Ref Range   hCG, Beta Chain, Quant, S 32,840 (H) <5 mIU/mL    Comment:          GEST. AGE      CONC.  (mIU/mL)   <=1 WEEK  5 - 50     2 WEEKS       50 - 500     3 WEEKS       100 - 10,000     4 WEEKS     1,000 - 30,000     5 WEEKS     3,500 - 115,000   6-8 WEEKS     12,000 - 270,000    12 WEEKS     15,000 - 220,000        FEMALE AND NON-PREGNANT FEMALE:     LESS THAN 5 mIU/mL Performed at [facility], [address], [place], [place] [address]     US OB LESS THAN 14 WEEKS WITH [place]  TRANSVAGINAL  Result Date: 06/27/2021 CLINICAL DATA:  Pelvic pain and cramping. LMP: 05/19/2021 corresponding to an estimated gestational age of [redacted] weeks, 4 days. EXAM: OBSTETRIC <14 WK [place] AND TRANSVAGINAL OB US TECHNIQUE: Both transabdominal and transvaginal ultrasound examinations were performed for complete evaluation of the gestation as well as the maternal uterus, adnexal regions, and pelvic cul-de-sac. Transvaginal technique was performed to assess early pregnancy. COMPARISON:  None. FINDINGS: Intrauterine gestational sac: Single intrauterine gestational sac. Yolk sac:  Seen Embryo:  Not present Cardiac Activity: N/A MSD: 14 mm   6 w   2 d Subchorionic hemorrhage:  None visualized. Maternal uterus/adnexae: The maternal ovaries unremarkable. IMPRESSION: Single intrauterine gestational sac with an estimated gestational age of [redacted] weeks, 2 days. No fetal pole identified at this time. Follow-up with ultrasound in 7-11 days, or earlier if clinically indicated, recommended. Electronically Signed   By: Elgie Collard M.D.   On: 06/27/2021 20:36     Review of Systems  Gastrointestinal:  Positive for abdominal pain, nausea and vomiting.  Genitourinary:  Negative for vaginal bleeding.  Physical Exam   Blood pressure 118/81, pulse 90, temperature 98.5 F (36.9 C), temperature source Oral, resp. rate 17, height  (1.753 m), weight 98.4 kg, last menstrual period 05/19/2021, SpO2 100 %, unknown if currently breastfeeding.  Physical Exam Vitals and nursing note reviewed.  Constitutional:      General: She is not in acute distress.    Appearance: She is well-developed. She is not ill-appearing, toxic-appearing or diaphoretic.  HENT:     Head: Normocephalic.  Pulmonary:     Effort: Pulmonary effort is normal.  Abdominal:     Tenderness: There is abdominal tenderness in the periumbilical area and suprapubic area. There is no guarding or rebound.     Hernia: No hernia is present.  Neurological:      Mental Status: She is alert and oriented to person, place, and time.   MAU Course  Procedures  MDM  CBC, Hcg, ABO US OB transvaginal   LR bolus & Phenergan bolus Pepcid IV given Report given to Sharen Counter CNM who resumes care of the patient @ [address], Harolyn Rutherford, NP    Results for orders placed or performed during the hospital encounter of 06/27/21 (from the past 24 hour(s))  Urinalysis, Routine w reflex microscopic Urine, Clean Catch     Status: Abnormal   Collection Time: 06/27/21  6:35 PM  Result Value Ref Range   Color, Urine YELLOW YELLOW   APPearance CLEAR CLEAR   Specific Gravity, Urine 1.020 1.005 - 1.030   pH 7.0 5.0 - 8.0   Glucose, UA NEGATIVE NEGATIVE mg/dL   Hgb urine dipstick NEGATIVE NEGATIVE   Bilirubin Urine NEGATIVE NEGATIVE   Ketones, ur  40 (A) NEGATIVE mg/dL   Protein, ur NEGATIVE NEGATIVE mg/dL   Nitrite NEGATIVE NEGATIVE   Leukocytes,Ua NEGATIVE NEGATIVE  Pregnancy, urine POC     Status: Abnormal   Collection Time: 06/27/21  6:46 PM  Result Value Ref Range   Preg Test, Ur POSITIVE (A) NEGATIVE  CBC with Differential/Platelet     Status: Abnormal   Collection Time: 06/27/21  8:01 PM  Result Value Ref Range   WBC 10.7 (H) 4.0 - 10.5 K/uL   RBC 4.76 3.87 - 5.11 MIL/uL   Hemoglobin 13.8 12.0 - 15.0 g/dL   HCT 24.4 01.0 - 27.2 %   MCV 86.6 80.0 - 100.0 fL   MCH 29.0 26.0 - 34.0 pg   MCHC 33.5 30.0 - 36.0 g/dL   RDW 53.6 64.4 - 03.4 %   Platelets 299 150 - 400 K/uL   nRBC 0.0 0.0 - 0.2 %   Neutrophils Relative % 86 %   Neutro Abs 9.2 (H) 1.7 - 7.7 K/uL   Lymphocytes Relative 10 %   Lymphs Abs 1.1 0.7 - 4.0 K/uL   Monocytes Relative 4 %   Monocytes Absolute 0.4 0.1 - 1.0 K/uL   Eosinophils Relative 0 %   Eosinophils Absolute 0.0 0.0 - 0.5 K/uL   Basophils Relative 0 %   Basophils Absolute 0.0 0.0 - 0.1 K/uL   Immature Granulocytes 0 %   Abs Immature Granulocytes 0.03 0.00 - 0.07 K/uL  Comprehensive metabolic panel     Status:  Abnormal   Collection Time: 06/27/21  8:01 PM  Result Value Ref Range   Sodium 135 135 - 145 mmol/L   Potassium 3.5 3.5 - 5.1 mmol/L   Chloride 104 98 - 111 mmol/L   CO2 22 22 - 32 mmol/L   Glucose, Bld 93 70 - 99 mg/dL   BUN 7 6 - 20 mg/dL   Creatinine, Ser 7.42 0.44 - 1.00 mg/dL   Calcium 8.7 (L) 8.9 - 10.3 mg/dL   Total Protein 6.5 6.5 - 8.1 g/dL   Albumin 3.7 3.5 - 5.0 g/dL   AST 19 15 - 41 U/L   ALT 19 0 - 44 U/L   Alkaline Phosphatase 50 38 - 126 U/L   Total Bilirubin 0.5 0.3 - 1.2 mg/dL   GFR, Estimated >59 >56 mL/min   Anion gap 9 5 - 15  ABO/Rh     Status: None   Collection Time: 06/27/21  8:01 PM  Result Value Ref Range   ABO/RH(D)      B POS Performed at [facility], [address], [place], [place] [address]   hCG, quantitative, pregnancy     Status: Abnormal   Collection Time: 06/27/21  8:01 PM  Result Value Ref Range   hCG, Beta Chain, Quant, S 32,840 (H) <5 mIU/mL    US OB LESS THAN 14 WEEKS WITH OB TRANSVAGINAL  Result Date: 06/27/2021 CLINICAL DATA:  Pelvic pain and cramping. LMP: 05/19/2021 corresponding to an estimated gestational age of [redacted] weeks, 4 days. EXAM: OBSTETRIC <14 WK [place] AND TRANSVAGINAL OB US TECHNIQUE: Both transabdominal and transvaginal ultrasound examinations were performed for complete evaluation of the gestation as well as the maternal uterus, adnexal regions, and pelvic cul-de-sac. Transvaginal technique was performed to assess early pregnancy. COMPARISON:  None. FINDINGS: Intrauterine gestational sac: Single intrauterine gestational sac. Yolk sac:  Seen Embryo:  Not present Cardiac Activity: N/A MSD: 14 mm   6 w   2 d Subchorionic hemorrhage:  None visualized. Maternal uterus/adnexae: The maternal ovaries unremarkable. IMPRESSION: Single intrauterine gestational sac with an estimated gestational age of [redacted] weeks, 2 days. No fetal pole identified at this time. Follow-up with ultrasound in 7-11 days, or earlier if clinically indicated,  recommended. Electronically Signed   By: Elgie Collard M.D.   On: 06/27/2021 20:36     MDM:  IUP noted on today's [place] with GS and YS but no FP.  Reviewed results with pt who plans to follow up with early prenatal care.  Nausea improved and pt tolerated PO food/fluids after antiemetics and IV fluids given. Rx for Phenergan Q HS and Reglan for PRN use in the daytime sent to pharmacy.  Return precautions given.  Assessment and Plan   1. Nausea and vomiting during pregnancy prior to [redacted] weeks gestation   2. [redacted] weeks gestation of pregnancy   3. Abdominal pain affecting pregnancy   4. Normal IUP (intrauterine pregnancy) on prenatal ultrasound, first trimester      D/C home  Sharen Counter, CNM 2:18 AM

## 2021-06-27 NOTE — MAU Provider Note (Incomplete Revision)
History     CSN: [id number]  Arrival date and time: 06/27/21 1835   Event Date/Time   First Provider Initiated Contact with Patient 06/27/21 1928      Chief Complaint  Patient presents with   Emesis   Possible Pregnancy   Abdominal Pain   HPI  [name] is a 30 y.o. female G3P2002 @ [redacted]w[redacted]d here in MAU with N/V and umbilical pain. She reports the vomiting started 5 days ago. Reports not keeping anything down in the last 24 hours. She has tried water and Gatorade, and crackers; she vomited all of this up. The last time she vomited was 1830 tonight. She has not tried any nausea medication. She reports pain in the umbilicus that radiates down the middle of her abdomen. The pain comes and goes. She reports a pressure feeling type pain. She has no bleeding.   OB History     Gravida  3   Para  2   Term  2   Preterm      AB      Living  2      SAB      IAB      Ectopic      Multiple  0   Live Births  2           Past Medical History:  Diagnosis Date   Headache(784.0) HEADACHES AS TEEN NONE IN 3 YEARS   Migraine     Past Surgical History:  Procedure Laterality Date   ANKLE RECONSTRUCTION  10/03/2011   Procedure: RECONSTRUCTION ANKLE;  Surgeon: Toni Arthurs, MD;  Location:  SUR[facility]ERY [facility];  Service: Orthopedics;  Laterality: Left;  left ankle modified brostrum lateral ligament reconstruction   ROOT CANAL     WISDOM TOOTH EXTRACTION      Family History  Problem Relation Age of Onset   Hypertension Mother    Thyroid disease Mother    Hypertension Father    Thyroid disease Sister     Social History   Tobacco Use   Smoking status: Never   Smokeless tobacco: Never  Vaping Use   Vaping Use: Never used  Substance Use Topics   Alcohol use: No   Drug use: No    Allergies:  Allergies  Allergen Reactions   Tape Rash    Adhesive     Medications Prior to Admission  Medication Sig Dispense Refill Last Dose   docusate sodium (COLACE)  100 MG capsule Take 1 capsule (100 mg total) by mouth 2 (two) times daily. 60 capsule 0    ibuprofen (ADVIL) 600 MG tablet Take 1 tablet (600 mg total) by mouth every 6 (six) hours as needed. 90 tablet 0    Results for orders placed or performed during the hospital encounter of 06/27/21 (from the past 48 hour(s))  Urinalysis, Routine w reflex microscopic Urine, Clean Catch     Status: Abnormal   Collection Time: 06/27/21  6:35 PM  Result Value Ref Range   Color, Urine YELLOW YELLOW   APPearance CLEAR CLEAR   Specific Gravity, Urine 1.020 1.005 - 1.030   pH 7.0 5.0 - 8.0   Glucose, UA NEGATIVE NEGATIVE mg/dL   Hgb urine dipstick NEGATIVE NEGATIVE   Bilirubin Urine NEGATIVE NEGATIVE   Ketones, ur 40 (A) NEGATIVE mg/dL   Protein, ur NEGATIVE NEGATIVE mg/dL   Nitrite NEGATIVE NEGATIVE   Leukocytes,Ua NEGATIVE NEGATIVE    Comment: Microscopic not done on urines with negative protein, blood, leukocytes, nitrite, or  glucose < 500 mg/dL. Performed at [facility], [address] [place], [place] [address]   Pregnancy, urine POC     Status: Abnormal   Collection Time: 06/27/21  6:46 PM  Result Value Ref Range   Preg Test, Ur POSITIVE (A) NEGATIVE    Comment:        THE SENSITIVITY OF THIS METHODOLOGY IS >24 mIU/mL   CBC with Differential/Platelet     Status: Abnormal   Collection Time: 06/27/21  8:01 PM  Result Value Ref Range   WBC 10.7 (H) 4.0 - 10.5 K/uL   RBC 4.76 3.87 - 5.11 MIL/uL   Hemoglobin 13.8 12.0 - 15.0 g/dL   HCT 38.8 82.8 - 00.3 %   MCV 86.6 80.0 - 100.0 fL   MCH 29.0 26.0 - 34.0 pg   MCHC 33.5 30.0 - 36.0 g/dL   RDW 49.1 79.1 - 50.5 %   Platelets 299 150 - 400 K/uL   nRBC 0.0 0.0 - 0.2 %   Neutrophils Relative % 86 %   Neutro Abs 9.2 (H) 1.7 - 7.7 K/uL   Lymphocytes Relative 10 %   Lymphs Abs 1.1 0.7 - 4.0 K/uL   Monocytes Relative 4 %   Monocytes Absolute 0.4 0.1 - 1.0 K/uL   Eosinophils Relative 0 %   Eosinophils Absolute 0.0 0.0 - 0.5 K/uL    Basophils Relative 0 %   Basophils Absolute 0.0 0.0 - 0.1 K/uL   Immature Granulocytes 0 %   Abs Immature Granulocytes 0.03 0.00 - 0.07 K/uL    Comment: Performed at [facility], [address], [place], [place] [address]  Comprehensive metabolic panel     Status: Abnormal   Collection Time: 06/27/21  8:01 PM  Result Value Ref Range   Sodium 135 135 - 145 mmol/L   Potassium 3.5 3.5 - 5.1 mmol/L   Chloride 104 98 - 111 mmol/L   CO2 22 22 - 32 mmol/L   Glucose, Bld 93 70 - 99 mg/dL    Comment: Glucose reference range applies only to samples taken after fasting for at least 8 hours.   BUN 7 6 - 20 mg/dL   Creatinine, Ser 8.01 0.44 - 1.00 mg/dL   Calcium 8.7 (L) 8.9 - 10.3 mg/dL   Total Protein 6.5 6.5 - 8.1 g/dL   Albumin 3.7 3.5 - 5.0 g/dL   AST 19 15 - 41 U/L   ALT 19 0 - 44 U/L   Alkaline Phosphatase 50 38 - 126 U/L   Total Bilirubin 0.5 0.3 - 1.2 mg/dL   GFR, Estimated >65 >53 mL/min    Comment: (NOTE) Calculated using the CKD-EPI Creatinine Equation (2021)    Anion gap 9 5 - 15    Comment: Performed at [facility], [address], [place], [place] [address]  ABO/Rh     Status: None   Collection Time: 06/27/21  8:01 PM  Result Value Ref Range   ABO/RH(D)      B POS Performed at [facility], [address] [place], [place] [address]   hCG, quantitative, pregnancy     Status: Abnormal   Collection Time: 06/27/21  8:01 PM  Result Value Ref Range   hCG, Beta Chain, Quant, S 32,840 (H) <5 mIU/mL    Comment:          GEST. AGE      CONC.  (mIU/mL)   <=1 WEEK  5 - 50     2 WEEKS       50 - 500     3 WEEKS       100 - 10,000     4 WEEKS     1,000 - 30,000     5 WEEKS     3,500 - 115,000   6-8 WEEKS     12,000 - 270,000    12 WEEKS     15,000 - 220,000        FEMALE AND NON-PREGNANT FEMALE:     LESS THAN 5 mIU/mL Performed at [facility], [address] [place], [place] [address]     US OB LESS THAN 14 WEEKS WITH [place]  TRANSVAGINAL  Result Date: 06/27/2021 CLINICAL DATA:  Pelvic pain and cramping. LMP: 05/19/2021 corresponding to an estimated gestational age of [redacted] weeks, 4 days. EXAM: OBSTETRIC <14 WK [place] AND TRANSVAGINAL OB US TECHNIQUE: Both transabdominal and transvaginal ultrasound examinations were performed for complete evaluation of the gestation as well as the maternal uterus, adnexal regions, and pelvic cul-de-sac. Transvaginal technique was performed to assess early pregnancy. COMPARISON:  None. FINDINGS: Intrauterine gestational sac: Single intrauterine gestational sac. Yolk sac:  Seen Embryo:  Not present Cardiac Activity: N/A MSD: 14 mm   6 w   2 d Subchorionic hemorrhage:  None visualized. Maternal uterus/adnexae: The maternal ovaries unremarkable. IMPRESSION: Single intrauterine gestational sac with an estimated gestational age of [redacted] weeks, 2 days. No fetal pole identified at this time. Follow-up with ultrasound in 7-11 days, or earlier if clinically indicated, recommended. Electronically Signed   By: Elgie Collard M.D.   On: 06/27/2021 20:36     Review of Systems  Gastrointestinal:  Positive for abdominal pain, nausea and vomiting.  Genitourinary:  Negative for vaginal bleeding.  Physical Exam   Blood pressure 118/81, pulse 90, temperature 98.5 F (36.9 C), temperature source Oral, resp. rate 17, height 5\' 9"  (1.753 m), weight 98.4 kg, last menstrual period 05/19/2021, SpO2 100 %, unknown if currently breastfeeding.  Physical Exam Vitals and nursing note reviewed.  Constitutional:      General: She is not in acute distress.    Appearance: She is well-developed. She is not ill-appearing, toxic-appearing or diaphoretic.  HENT:     Head: Normocephalic.  Pulmonary:     Effort: Pulmonary effort is normal.  Abdominal:     Tenderness: There is abdominal tenderness in the periumbilical area and suprapubic area. There is no guarding or rebound.     Hernia: No hernia is present.  Neurological:      Mental Status: She is alert and oriented to person, place, and time.   MAU Course  Procedures  MDM  CBC, Hcg, ABO 05/21/2021 OB transvaginal   LR bolus & Phenergan bolus Pepcid IV given Report given to [place] CNM who resumes care of the patient @ [address], Sharen Counter, NP   Assessment and Plan

## 2021-06-28 LAB — ABO/RH: ABO/RH(D): B POS

## 2021-07-13 LAB — OB RESULTS CONSOLE GC/CHLAMYDIA
Chlamydia: NEGATIVE
Neisseria Gonorrhea: NEGATIVE

## 2021-07-29 NOTE — L&D Delivery Note (Signed)
Delivery Note Michelle Ortiz is a [id number] at [redacted]w[redacted]d who had a spontaneous delivery at 1544 a viable female (undecided name) was delivered via  LOP.  APGAR: 9, 9 ; weight 3475g (7lb 10.6oz) .     Admitted for elective induction of labor.  Induced with pitocin and AROM. Progressed quickly after AROM. Received epidural for pain management. Pushed for 7 minutes. Baby was delivered without difficulty. No nuchal cord.  Delayed cord clamping for 60 seconds.  Delivery of placenta was spontaneous. Placenta was found to be intact, 3 -vessel cord was noted. The fundus was found to be firm. 2nd degree perineal laceration was repaired in the normal sterile fashion with 2-0 and 3-0 vicryl. DRE with good rectal tone and no sutures. Estimated blood loss 200cc. Instrument and gauze counts were correct at the end of the procedure.   Placenta status: to L&D  Anesthesia:  epidural Episiotomy:  none Lacerations:  2nd degree perineal Suture Repair: 2.0 3.0 vicryl Est. Blood Loss (mL):  200  Mom to postpartum.  Baby to Couplet care / Skin to Skin.  [name] 02/18/2022, 4:49 PM

## 2021-08-08 LAB — OB RESULTS CONSOLE HIV ANTIBODY (ROUTINE TESTING): HIV: NONREACTIVE

## 2021-08-08 LAB — OB RESULTS CONSOLE ABO/RH: RH Type: POSITIVE

## 2021-08-08 LAB — OB RESULTS CONSOLE ANTIBODY SCREEN: Antibody Screen: NEGATIVE

## 2021-08-08 LAB — OB RESULTS CONSOLE RPR: RPR: NONREACTIVE

## 2021-08-08 LAB — HEPATITIS C ANTIBODY: HCV Ab: NEGATIVE

## 2021-08-08 LAB — OB RESULTS CONSOLE HEPATITIS B SURFACE ANTIGEN: Hepatitis B Surface Ag: NEGATIVE

## 2021-08-08 LAB — OB RESULTS CONSOLE RUBELLA ANTIBODY, IGM: Rubella: IMMUNE

## 2021-12-24 ENCOUNTER — Ambulatory Visit
Admission: RE | Admit: 2021-12-24 | Discharge: 2021-12-24 | Disposition: A | Discharge status: Home / Self Care | Payer: BC Managed Care – PPO | Source: Ambulatory Visit

## 2021-12-24 VITALS — BP 124/76 | HR 99 | Temp 99.3°F | Resp 20

## 2021-12-24 DIAGNOSIS — H60391 Other infective otitis externa, right ear: Secondary | ICD-10-CM | POA: Diagnosis not present

## 2021-12-24 DIAGNOSIS — H66002 Acute suppurative otitis media without spontaneous rupture of ear drum, left ear: Secondary | ICD-10-CM | POA: Diagnosis not present

## 2021-12-24 MED ORDER — CEFDINIR 300 MG PO CAPS: 300.0000 mg | ORAL_CAPSULE | Freq: Two times a day (BID) | ORAL | 0 refills | Status: AC

## 2021-12-24 MED ORDER — CIPROFLOXACIN-DEXAMETHASONE 0.3-0.1 % OT SUSP: 4.0000 [drp] | Freq: Two times a day (BID) | OTIC | 0 refills | Status: DC

## 2021-12-24 NOTE — Discharge Instructions (Addendum)
To treat the external ear infection of your right ear, please begin Ciprodex eardrops, instill 4 drops in your right ear twice daily, if you would also like to use the drops in your left ear you are welcome to do so.  To treat the internal ear infection in both of your ears at this time, please begin cefdinir 1 capsule twice daily for a full 10 days.  Cefdinir is a third-generation cephalosporin and is safe to use during pregnancy after the first trimester.  There are no contraindications to this medication in pregnant patients past the first trimester.  Please feel free to follow-up with either your OB, your primary care provider or your urgent care if, after 10 days, you have not had significant improvement of your symptoms.  If you continue to have loss of hearing despite resolution of ear pain, strongly encourage you to follow-up with ear nose and throat specialist.  Thank you for visiting urgent care today.

## 2021-12-24 NOTE — ED Triage Notes (Addendum)
Pt c/o bilateral ear pain and difficulty hearing from both ears. The patient states she is on day 4 of amoxicillin.   The patient is currently pregnant.  Started: last Thursday.

## 2021-12-24 NOTE — ED Provider Notes (Signed)
[facility]    CSN: [id number] Arrival date & time: 12/24/21  1321    HISTORY   Chief Complaint  Patient presents with   Ear Drainage    Entered by patient   HPI Michelle Ortiz is a 31 y.o. female. Pt c/o bilateral ear pain and difficulty hearing from both ears. The patient states she is on day 4 of amoxicillin 500 mg 3 times daily that was prescribed for her at another urgent care for bilateral ear infection, states at this time, her pain is worse, she has mucousy drainage on her pillowcase every morning from her right ear, is unable to hear out of her right ear at all and feels that her hearing in her left ear is starting to fade as well.  Patient states she is also been coughing up copious amounts of dark gray sputum every morning which slowly fades to more whitish-yellow color throughout the day, states every morning she is coughing up more.  Patient denies shortness of breath at this time.  Patient denies history of frequent ear infections.  Patient states she is [redacted] weeks pregnant.  The history is provided by the patient.  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 06/12/2018   Intractable migraine with aura without status migrainosus 09/23/2016   Chronic tension-type headache, intractable 09/23/2016   Past Surgical History:  Procedure Laterality Date   ANKLE RECONSTRUCTION  10/03/2011   Procedure: RECONSTRUCTION ANKLE;  Surgeon: Toni ArthursJohn Hewitt, MD;  Location: [facility];  Service: Orthopedics;  Laterality: Left;  left ankle modified brostrum lateral ligament reconstruction   ROOT CANAL     WISDOM TOOTH EXTRACTION     OB History     Gravida  3   Para  2   Term  2   Preterm      AB      Living  2      SAB      IAB       Ectopic      Multiple  0   Live Births  2          Home Medications    Prior to Admission medications   Medication Sig Start Date End Date Taking? Authorizing Provider  amoxicillin (AMOXIL) 500 MG capsule Take 500 mg by mouth 3 (three) times daily. 12/21/21   [provider]   Family History Family History  Problem Relation Age of Onset   Hypertension Mother    Thyroid disease Mother    Hypertension Father    Thyroid disease Sister    Social History Social History   Tobacco Use   Smoking status: Never   Smokeless tobacco: Never  Vaping Use   Vaping Use: Never used  Substance Use Topics   Alcohol use: No   Drug use: No   Allergies   Tape  Review of Systems Review of Systems Pertinent findings noted in history of present illness.   Physical Exam Triage Vital Signs ED Triage Vitals  Enc Vitals Group     BP 05/25/21 0827 (!) 147/82     Pulse Rate 05/25/21 0827 72     Resp 05/25/21 0827 18     Temp 05/25/21 0827 98.3 F (36.8 C)  Temp Source 05/25/21 0827 Oral     SpO2 05/25/21 0827 98 %     Weight --      Height --      Head Circumference --      Peak Flow --      Pain Score 05/25/21 0826 5     Pain Loc --      Pain Edu? --      Excl. in GC? --   No data found.  Updated Vital Signs BP 124/76 (BP Location: Left Arm)   Pulse 99   Temp 99.3 F (37.4 C) (Oral)   Resp 20   LMP 05/19/2021   SpO2 97%   Physical Exam Vitals and nursing note reviewed.  Constitutional:      General: She is not in acute distress.    Appearance: Normal appearance. She is not ill-appearing.  HENT:     Head: Normocephalic and atraumatic.     Salivary Glands: Right salivary gland is not diffusely enlarged or tender. Left salivary gland is not diffusely enlarged or tender.     Right Ear: Ear canal normal. Decreased hearing noted. No drainage. A middle ear effusion (Purulent) is present. There is no impacted cerumen. There is mastoid tenderness. No  hemotympanum. Tympanic membrane is injected, erythematous and retracted. Tympanic membrane is not scarred, perforated or bulging.     Left Ear: Ear canal and external ear normal. Decreased hearing noted. No drainage.  No middle ear effusion. There is no impacted cerumen. No mastoid tenderness. No hemotympanum. Tympanic membrane is injected and retracted. Tympanic membrane is not scarred, perforated, erythematous or bulging.     Nose: Nose normal. No nasal deformity, septal deviation, mucosal edema, congestion or rhinorrhea.     Right Turbinates: Not enlarged, swollen or pale.     Left Turbinates: Not enlarged, swollen or pale.     Right Sinus: No maxillary sinus tenderness or frontal sinus tenderness.     Left Sinus: No maxillary sinus tenderness or frontal sinus tenderness.     Mouth/Throat:     Lips: Pink. No lesions.     Mouth: Mucous membranes are moist. No oral lesions.     Pharynx: Oropharynx is clear. Uvula midline. No posterior oropharyngeal erythema or uvula swelling.     Tonsils: No tonsillar exudate. 0 on the right. 0 on the left.  Eyes:     General: Lids are normal.        Right eye: No discharge.        Left eye: No discharge.     Extraocular Movements: Extraocular movements intact.     Conjunctiva/sclera: Conjunctivae normal.     Right eye: Right conjunctiva is not injected.     Left eye: Left conjunctiva is not injected.  Neck:     Trachea: Trachea and phonation normal.  Cardiovascular:     Rate and Rhythm: Normal rate and regular rhythm.     Pulses: Normal pulses.     Heart sounds: Normal heart sounds. No murmur heard.   No friction rub. No gallop.  Pulmonary:     Effort: Pulmonary effort is normal. No accessory muscle usage, prolonged expiration or respiratory distress.     Breath sounds: Normal breath sounds. No stridor, decreased air movement or transmitted upper airway sounds. No decreased breath sounds, wheezing, rhonchi or rales.  Chest:     Chest wall: No  tenderness.  Musculoskeletal:        General: Normal range of motion.  Cervical back: Normal range of motion and neck supple. Normal range of motion.  Lymphadenopathy:     Cervical: No cervical adenopathy.  Skin:    General: Skin is warm and dry.     Findings: No erythema or rash.  Neurological:     General: No focal deficit present.     Mental Status: She is alert and oriented to person, place, and time.  Psychiatric:        Mood and Affect: Mood normal.        Behavior: Behavior normal.    Visual Acuity Right Eye Distance:   Left Eye Distance:   Bilateral Distance:    Right Eye Near:   Left Eye Near:    Bilateral Near:     UC Couse / Diagnostics / Procedures:    EKG  Radiology No results found.  Procedures Procedures (including critical care time)  UC Diagnoses / Final Clinical Impressions(s)   I have reviewed the triage vital signs and the nursing notes.  Pertinent labs & imaging results that were available during my care of the patient were reviewed by me and considered in my medical decision making (see chart for details).   Final diagnoses:  Other infective acute otitis externa of right ear  Acute suppurative otitis media of left ear   Breath sounds are normal on physical exam today.  I am concerned that patient likely has a sinus infection causing drainage to her ears that is being significantly undertreated with amoxicillin 500 mg 3 times daily.  Because patient is [redacted] weeks pregnant, it is safe for her to begin taking cefdinir at this time.  I provided her with a prescription for 10-day course.  I also provided her with ciprofloxacin-dexamethasone drops to instill twice daily in her right ear, patient asked if she could do it in both ears which I told her would be fine.  Return precautions advised.  ED Prescriptions     Medication Sig Dispense Auth. Provider   ciprofloxacin-dexamethasone (CIPRODEX) OTIC suspension Place 4 drops into both ears 2 (two)  times daily. X 7 days 7.5 mL Theadora Rama Scales, PA-C   cefdinir (OMNICEF) 300 MG capsule Take 1 capsule (300 mg total) by mouth 2 (two) times daily for 10 days. 20 capsule Theadora Rama Scales, PA-C      PDMP not reviewed this encounter.  Pending results:  Labs Reviewed - No data to display  Medications Ordered in UC: Medications - No data to display  Disposition Upon Discharge:  Condition: stable for discharge home Home: take medications as prescribed; routine discharge instructions as discussed; follow up as advised.  Patient presented with an acute illness with associated systemic symptoms and significant discomfort requiring urgent management. In my opinion, this is a condition that a prudent lay person (someone who possesses an average knowledge of health and medicine) may potentially expect to result in complications if not addressed urgently such as respiratory distress, impairment of bodily function or dysfunction of bodily organs.   Routine symptom specific, illness specific and/or disease specific instructions were discussed with the patient and/or caregiver at length.   As such, the patient has been evaluated and assessed, work-up was performed and treatment was provided in alignment with urgent care protocols and evidence based medicine.  Patient/parent/caregiver has been advised that the patient may require follow up for further testing and treatment if the symptoms continue in spite of treatment, as clinically indicated and appropriate.  If the patient was tested for COVID-19, Influenza  and/or RSV, then the patient/parent/guardian was advised to isolate at home pending the results of his/her diagnostic coronavirus test and potentially longer if they're positive. I have also advised pt that if his/her COVID-19 test returns positive, it's recommended to self-isolate for at least 10 days after symptoms first appeared AND until fever-free for 24 hours without fever reducer AND  other symptoms have improved or resolved. Discussed self-isolation recommendations as well as instructions for household member/close contacts as per the [facility] and [place] [facility], and also gave patient the COVID packet with this information.  Patient/parent/caregiver has been advised to return to the [facility] or PCP in 3-5 days if no better; to PCP or the Emergency Department if new signs and symptoms develop, or if the current signs or symptoms continue to change or worsen for further workup, evaluation and treatment as clinically indicated and appropriate  The patient will follow up with their current PCP if and as advised. If the patient does not currently have a PCP we will assist them in obtaining one.   The patient may need specialty follow up if the symptoms continue, in spite of conservative treatment and management, for further workup, evaluation, consultation and treatment as clinically indicated and appropriate.  Patient/parent/caregiver verbalized understanding and agreement of plan as discussed.  All questions were addressed during visit.  Please see discharge instructions below for further details of plan.  Discharge Instructions:   Discharge Instructions      To treat the external ear infection of your right ear, please begin Ciprodex eardrops, instill 4 drops in your right ear twice daily, if you would also like to use the drops in your left ear you are welcome to do so.  To treat the internal ear infection in both of your ears at this time, please begin cefdinir 1 capsule twice daily for a full 10 days.  Cefdinir is a third-generation cephalosporin and is safe to use during pregnancy after the first trimester.  There are no contraindications to this medication in pregnant patients past the first trimester.  Please feel free to follow-up with either your OB, your primary care provider or your urgent care if, after 10 days, you have not had significant improvement of your symptoms.  If you  continue to have loss of hearing despite resolution of ear pain, strongly encourage you to follow-up with ear nose and throat specialist.  Thank you for visiting urgent care today.      This office note has been dictated using Teaching laboratory technician.  Unfortunately, and despite my best efforts, this method of dictation can sometimes lead to occasional typographical or grammatical errors.  I apologize in advance if this occurs.     Theadora Rama Scales, PA-C 12/24/21 1409

## 2022-01-26 ENCOUNTER — Encounter (HOSPITAL_COMMUNITY): Payer: Self-pay | Admitting: Obstetrics and Gynecology

## 2022-01-26 ENCOUNTER — Inpatient Hospital Stay (HOSPITAL_COMMUNITY)
Admission: AD | Admit: 2022-01-26 | Discharge: 2022-01-27 | Disposition: A | Discharge status: Home / Self Care | Payer: BC Managed Care – PPO | Attending: Obstetrics and Gynecology | Admitting: Obstetrics and Gynecology

## 2022-01-26 DIAGNOSIS — Z3A36 36 weeks gestation of pregnancy: Secondary | ICD-10-CM | POA: Diagnosis not present

## 2022-01-26 DIAGNOSIS — M545 Low back pain, unspecified: Secondary | ICD-10-CM | POA: Insufficient documentation

## 2022-01-26 DIAGNOSIS — Z3689 Encounter for other specified antenatal screening: Secondary | ICD-10-CM

## 2022-01-26 DIAGNOSIS — O99891 Other specified diseases and conditions complicating pregnancy: Secondary | ICD-10-CM | POA: Diagnosis present

## 2022-01-26 DIAGNOSIS — O26893 Other specified pregnancy related conditions, third trimester: Secondary | ICD-10-CM | POA: Diagnosis not present

## 2022-01-26 LAB — URINALYSIS, ROUTINE W REFLEX MICROSCOPIC
Bilirubin Urine: NEGATIVE
Glucose, UA: NEGATIVE mg/dL
Hgb urine dipstick: NEGATIVE
Ketones, ur: NEGATIVE mg/dL
Leukocytes,Ua: NEGATIVE
Nitrite: NEGATIVE
Protein, ur: NEGATIVE mg/dL
Specific Gravity, Urine: 1.005 (ref 1.005–1.030)
pH: 6 (ref 5.0–8.0)

## 2022-01-26 MED ORDER — CYCLOBENZAPRINE HCL 5 MG PO TABS
5.0000 mg | ORAL_TABLET | Freq: Once | ORAL | Status: AC
  Administered 2022-01-26: 5 mg via ORAL
  Filled 2022-01-26: qty 1

## 2022-01-26 MED ORDER — ACETAMINOPHEN 500 MG PO TABS
1000.0000 mg | ORAL_TABLET | Freq: Once | ORAL | Status: AC
Start: 2022-01-26 — End: 2022-01-26
  Administered 2022-01-26: 1000 mg via ORAL
  Filled 2022-01-26: qty 2

## 2022-01-26 NOTE — MAU Provider Note (Signed)
  History     CSN: [id number]  Arrival date and time: 01/26/22 2142   Event Date/Time   First Provider Initiated Contact with Patient 01/26/22 2303      Chief Complaint  Patient presents with   Back Pain    Tailbone and periods of lightening crotch   HPI  {GYN/OB [id number]}  Past Medical History:  Diagnosis Date   Headache(784.0) HEADACHES AS TEEN NONE IN 3 YEARS   Migraine     Past Surgical History:  Procedure Laterality Date   ANKLE RECONSTRUCTION  10/03/2011   Procedure: RECONSTRUCTION ANKLE;  Surgeon: Toni Arthurs, MD;  Location: [facility];  Service: Orthopedics;  Laterality: Left;  left ankle modified brostrum lateral ligament reconstruction   ROOT CANAL     WISDOM TOOTH EXTRACTION      Family History  Problem Relation Age of Onset   Hypertension Mother    Thyroid disease Mother    Hypertension Father    Thyroid disease Sister     Social History   Tobacco Use   Smoking status: Never   Smokeless tobacco: Never  Vaping Use   Vaping Use: Never used  Substance Use Topics   Alcohol use: No   Drug use: No    Allergies:  Allergies  Allergen Reactions   Tape Rash    Adhesive     Medications Prior to Admission  Medication Sig Dispense Refill Last Dose   Prenatal Vit-Fe Fumarate-FA (PRENATAL VITAMIN PO) Take 1 tablet by mouth daily.   01/26/2022   ciprofloxacin-dexamethasone (CIPRODEX) OTIC suspension Place 4 drops into both ears 2 (two) times daily. X 7 days 7.5 mL 0     Review of Systems Physical Exam   Blood pressure 121/72, pulse 96, temperature 98.5 F (36.9 C), resp. rate 17, height 5\' 9"  (1.753 m), weight 110.2 kg, last menstrual period 05/19/2021, unknown if currently breastfeeding.  Physical Exam  Fetal Assessment *** bpm, Mod Var, -Decels, +Accels Toco:   MAU Course   Results for orders placed or performed during the hospital encounter of 01/26/22 (from the past 24 hour(s))  Urinalysis, Routine w reflex microscopic Urine,  Clean Catch     Status: None   Collection Time: 01/26/22 10:10 PM  Result Value Ref Range   Color, Urine YELLOW YELLOW   APPearance CLEAR CLEAR   Specific Gravity, Urine 1.005 1.005 - 1.030   pH 6.0 5.0 - 8.0   Glucose, UA NEGATIVE NEGATIVE mg/dL   Hgb urine dipstick NEGATIVE NEGATIVE   Bilirubin Urine NEGATIVE NEGATIVE   Ketones, ur NEGATIVE NEGATIVE mg/dL   Protein, ur NEGATIVE NEGATIVE mg/dL   Nitrite NEGATIVE NEGATIVE   Leukocytes,Ua NEGATIVE NEGATIVE   No results found.  MDM PE Labs: EFM  Assessment and Plan  *** G***P***  SIUP at ***weeks Cat *** FT   -Exam findings discussed. 03/29/22 MSN, CNM 01/26/2022, 11:03 PM

## 2022-01-26 NOTE — MAU Note (Signed)
.  Michelle Ortiz is a 31 y.o. at [redacted]w[redacted]d here in MAU reporting: yesterday evening at 2200 pt began having lower back and tailbone pain. Denies SROM, vaginal bleeding or bloody show. Endorses + fetal movement. Pt voices concern because she "accidentally " delivered her last child at home. Pt states she does not feel painful contractions just [employer].  Onset of complaint: 01/25/2022 2200 Pain score: 5 back and tail bone There were no vitals filed for this visit.   [id number] Lab orders placed from triage:  UA

## 2022-01-26 NOTE — MAU Provider Note (Incomplete)
History     CSN: [id number]  Arrival date and time: 01/26/22 2142   Event Date/Time   First Provider Initiated Contact with Patient 01/26/22 2303      Chief Complaint  Patient presents with  . Back Pain    Tailbone and periods of lightening crotch   Michelle Ortiz is a 31 y.o. G3P2002 at [redacted]w[redacted]d who receives care at [facility].  She states her next appt is scheduled for July 5th.  She presents today for Back Pain.    OB History     Gravida  3   Para  2   Term  2   Preterm      AB      Living  2      SAB      IAB      Ectopic      Multiple  0   Live Births  2           Past Medical History:  Diagnosis Date  . Headache(784.0) HEADACHES AS TEEN NONE IN 3 YEARS  . Migraine     Past Surgical History:  Procedure Laterality Date  . ANKLE RECONSTRUCTION  10/03/2011   Procedure: RECONSTRUCTION ANKLE;  Surgeon: Toni Arthurs, MD;  Location: [facility];  Service: Orthopedics;  Laterality: Left;  left ankle modified brostrum lateral ligament reconstruction  . ROOT CANAL    . WISDOM TOOTH EXTRACTION      Family History  Problem Relation Age of Onset  . Hypertension Mother   . Thyroid disease Mother   . Hypertension Father   . Thyroid disease Sister     Social History   Tobacco Use  . Smoking status: Never  . Smokeless tobacco: Never  Vaping Use  . Vaping Use: Never used  Substance Use Topics  . Alcohol use: No  . Drug use: No    Allergies:  Allergies  Allergen Reactions  . Tape Rash    Adhesive     Medications Prior to Admission  Medication Sig Dispense Refill Last Dose  . Prenatal Vit-Fe Fumarate-FA (PRENATAL VITAMIN PO) Take 1 tablet by mouth daily.   01/26/2022  . ciprofloxacin-dexamethasone (CIPRODEX) OTIC suspension Place 4 drops into both ears 2 (two) times daily. X 7 days 7.5 mL 0     Review of Systems  Genitourinary:  Negative for vaginal bleeding and vaginal discharge.   Physical Exam   Blood pressure 121/72,  pulse 96, temperature 98.5 F (36.9 C), resp. rate 17, height 5\' 9"  (1.753 m), weight 110.2 kg, last menstrual period 05/19/2021, unknown if currently breastfeeding.  Physical Exam  Fetal Assessment *** bpm, Mod Var, -Decels, +Accels Toco: No ctx  MAU Course   Results for orders placed or performed during the hospital encounter of 01/26/22 (from the past 24 hour(s))  Urinalysis, Routine w reflex microscopic Urine, Clean Catch     Status: None   Collection Time: 01/26/22 10:10 PM  Result Value Ref Range   Color, Urine YELLOW YELLOW   APPearance CLEAR CLEAR   Specific Gravity, Urine 1.005 1.005 - 1.030   pH 6.0 5.0 - 8.0   Glucose, UA NEGATIVE NEGATIVE mg/dL   Hgb urine dipstick NEGATIVE NEGATIVE   Bilirubin Urine NEGATIVE NEGATIVE   Ketones, ur NEGATIVE NEGATIVE mg/dL   Protein, ur NEGATIVE NEGATIVE mg/dL   Nitrite NEGATIVE NEGATIVE   Leukocytes,Ua NEGATIVE NEGATIVE   No results found.  MDM PE Labs: UA EFM  Assessment and Plan  31 year old  G***P***  SIUP at ***weeks Cat *** FT   -Exam findings discussed. Cherre Robins MSN, CNM 01/26/2022, 11:03 PM

## 2022-01-27 DIAGNOSIS — Z3A36 36 weeks gestation of pregnancy: Secondary | ICD-10-CM | POA: Diagnosis not present

## 2022-01-27 DIAGNOSIS — O26893 Other specified pregnancy related conditions, third trimester: Secondary | ICD-10-CM | POA: Diagnosis not present

## 2022-01-27 DIAGNOSIS — M545 Low back pain, unspecified: Secondary | ICD-10-CM | POA: Diagnosis not present

## 2022-01-27 MED ORDER — CYCLOBENZAPRINE HCL 5 MG PO TABS: 5.0000 mg | ORAL_TABLET | Freq: Three times a day (TID) | ORAL | 2 refills | Status: DC | PRN

## 2022-01-27 MED ORDER — CYCLOBENZAPRINE HCL 5 MG PO TABS: 5.0000 mg | ORAL_TABLET | Freq: Three times a day (TID) | ORAL | 1 refills | Status: DC | PRN

## 2022-01-30 LAB — OB RESULTS CONSOLE GBS: GBS: POSITIVE

## 2022-02-13 ENCOUNTER — Telehealth (HOSPITAL_COMMUNITY): Payer: Self-pay | Admitting: *Deleted

## 2022-02-13 ENCOUNTER — Encounter (HOSPITAL_COMMUNITY): Payer: Self-pay

## 2022-02-13 NOTE — Telephone Encounter (Signed)
Preadmission screen  

## 2022-02-14 ENCOUNTER — Encounter (HOSPITAL_COMMUNITY): Payer: Self-pay | Admitting: *Deleted

## 2022-02-14 ENCOUNTER — Telehealth (HOSPITAL_COMMUNITY): Payer: Self-pay | Admitting: *Deleted

## 2022-02-14 NOTE — Telephone Encounter (Signed)
Preadmission screen  

## 2022-02-18 ENCOUNTER — Encounter (HOSPITAL_COMMUNITY): Payer: Self-pay | Admitting: Obstetrics and Gynecology

## 2022-02-18 ENCOUNTER — Other Ambulatory Visit: Payer: Self-pay

## 2022-02-18 ENCOUNTER — Inpatient Hospital Stay (HOSPITAL_COMMUNITY)
Admission: RE | Admit: 2022-02-18 | Discharge: 2022-02-19 | DRG: 807 | Disposition: A | Discharge status: Home / Self Care | Payer: BC Managed Care – PPO | Attending: Obstetrics and Gynecology | Admitting: Obstetrics and Gynecology

## 2022-02-18 ENCOUNTER — Inpatient Hospital Stay (HOSPITAL_COMMUNITY): Payer: BC Managed Care – PPO

## 2022-02-18 ENCOUNTER — Inpatient Hospital Stay (HOSPITAL_COMMUNITY): Payer: BC Managed Care – PPO | Admitting: Anesthesiology

## 2022-02-18 DIAGNOSIS — Z3A39 39 weeks gestation of pregnancy: Secondary | ICD-10-CM

## 2022-02-18 DIAGNOSIS — O99214 Obesity complicating childbirth: Secondary | ICD-10-CM | POA: Diagnosis present

## 2022-02-18 DIAGNOSIS — O99824 Streptococcus B carrier state complicating childbirth: Secondary | ICD-10-CM | POA: Diagnosis present

## 2022-02-18 DIAGNOSIS — O26893 Other specified pregnancy related conditions, third trimester: Secondary | ICD-10-CM | POA: Diagnosis present

## 2022-02-18 LAB — TYPE AND SCREEN
ABO/RH(D): B POS
Antibody Screen: NEGATIVE

## 2022-02-18 LAB — CBC
HCT: 34 % — ABNORMAL LOW (ref 36.0–46.0)
Hemoglobin: 11.5 g/dL — ABNORMAL LOW (ref 12.0–15.0)
MCH: 28.5 pg (ref 26.0–34.0)
MCHC: 33.8 g/dL (ref 30.0–36.0)
MCV: 84.4 fL (ref 80.0–100.0)
Platelets: 195 10*3/uL (ref 150–400)
RBC: 4.03 MIL/uL (ref 3.87–5.11)
RDW: 13.7 % (ref 11.5–15.5)
WBC: 7.7 10*3/uL (ref 4.0–10.5)
nRBC: 0 % (ref 0.0–0.2)

## 2022-02-18 LAB — RPR: RPR Ser Ql: NONREACTIVE

## 2022-02-18 MED ORDER — LACTATED RINGERS IV SOLN: 500.0000 mL | Freq: Once | INTRAVENOUS | Status: DC

## 2022-02-18 MED ORDER — LIDOCAINE HCL (PF) 1 % IJ SOLN
INTRAMUSCULAR | Status: DC | PRN
  Administered 2022-02-18 (×2): 5 mL via EPIDURAL

## 2022-02-18 MED ORDER — DOCUSATE SODIUM 100 MG PO CAPS
100.0000 mg | ORAL_CAPSULE | Freq: Two times a day (BID) | ORAL | Status: DC
  Administered 2022-02-19: 100 mg via ORAL
  Filled 2022-02-18: qty 1

## 2022-02-18 MED ORDER — OXYTOCIN-SODIUM CHLORIDE 30-0.9 UT/500ML-% IV SOLN
1.0000 m[IU]/min | INTRAVENOUS | Status: DC
  Administered 2022-02-18: 2 m[IU]/min via INTRAVENOUS
  Filled 2022-02-18: qty 500

## 2022-02-18 MED ORDER — DIBUCAINE (PERIANAL) 1 % EX OINT: 1.0000 | TOPICAL_OINTMENT | CUTANEOUS | Status: DC | PRN

## 2022-02-18 MED ORDER — OXYCODONE-ACETAMINOPHEN 5-325 MG PO TABS: 1.0000 | ORAL_TABLET | ORAL | Status: DC | PRN

## 2022-02-18 MED ORDER — EPHEDRINE 5 MG/ML INJ: 10.0000 mg | INTRAVENOUS | Status: DC | PRN

## 2022-02-18 MED ORDER — LIDOCAINE HCL (PF) 1 % IJ SOLN: 30.0000 mL | INTRAMUSCULAR | Status: DC | PRN

## 2022-02-18 MED ORDER — PHENYLEPHRINE 80 MCG/ML (10ML) SYRINGE FOR IV PUSH (FOR BLOOD PRESSURE SUPPORT)
80.0000 ug | PREFILLED_SYRINGE | INTRAVENOUS | Status: DC | PRN
  Administered 2022-02-18: 80 ug via INTRAVENOUS

## 2022-02-18 MED ORDER — ONDANSETRON HCL 4 MG/2ML IJ SOLN: 4.0000 mg | Freq: Four times a day (QID) | INTRAMUSCULAR | Status: DC | PRN

## 2022-02-18 MED ORDER — FLEET ENEMA 7-19 GM/118ML RE ENEM: 1.0000 | ENEMA | Freq: Every day | RECTAL | Status: DC | PRN

## 2022-02-18 MED ORDER — WITCH HAZEL-GLYCERIN EX PADS: 1.0000 | MEDICATED_PAD | CUTANEOUS | Status: DC | PRN

## 2022-02-18 MED ORDER — COCONUT OIL OIL: 1.0000 | TOPICAL_OIL | Status: DC | PRN

## 2022-02-18 MED ORDER — OXYCODONE HCL 5 MG PO TABS
5.0000 mg | ORAL_TABLET | ORAL | Status: DC | PRN
  Administered 2022-02-18: 5 mg via ORAL
  Filled 2022-02-18: qty 1

## 2022-02-18 MED ORDER — TETANUS-DIPHTH-ACELL PERTUSSIS 5-2.5-18.5 LF-MCG/0.5 IM SUSY: 0.5000 mL | PREFILLED_SYRINGE | Freq: Once | INTRAMUSCULAR | Status: DC

## 2022-02-18 MED ORDER — BISACODYL 10 MG RE SUPP: 10.0000 mg | Freq: Every day | RECTAL | Status: DC | PRN

## 2022-02-18 MED ORDER — PENICILLIN G POT IN DEXTROSE 60000 UNIT/ML IV SOLN
3.0000 10*6.[IU] | INTRAVENOUS | Status: DC
  Administered 2022-02-18: 3 10*6.[IU] via INTRAVENOUS
  Filled 2022-02-18: qty 50

## 2022-02-18 MED ORDER — IBUPROFEN 600 MG PO TABS
600.0000 mg | ORAL_TABLET | Freq: Four times a day (QID) | ORAL | Status: DC
  Administered 2022-02-19 (×3): 600 mg via ORAL
  Filled 2022-02-18 (×4): qty 1

## 2022-02-18 MED ORDER — OXYTOCIN-SODIUM CHLORIDE 30-0.9 UT/500ML-% IV SOLN: 2.5000 [IU]/h | INTRAVENOUS | Status: DC

## 2022-02-18 MED ORDER — ONDANSETRON HCL 4 MG/2ML IJ SOLN: 4.0000 mg | INTRAMUSCULAR | Status: DC | PRN

## 2022-02-18 MED ORDER — OXYCODONE-ACETAMINOPHEN 5-325 MG PO TABS: 2.0000 | ORAL_TABLET | ORAL | Status: DC | PRN

## 2022-02-18 MED ORDER — ACETAMINOPHEN 325 MG PO TABS
650.0000 mg | ORAL_TABLET | ORAL | Status: DC | PRN
  Administered 2022-02-19: 650 mg via ORAL
  Filled 2022-02-18: qty 2

## 2022-02-18 MED ORDER — LACTATED RINGERS IV SOLN: 500.0000 mL | INTRAVENOUS | Status: DC | PRN

## 2022-02-18 MED ORDER — SIMETHICONE 80 MG PO CHEW: 80.0000 mg | CHEWABLE_TABLET | ORAL | Status: DC | PRN

## 2022-02-18 MED ORDER — PRENATAL MULTIVITAMIN CH
1.0000 | ORAL_TABLET | Freq: Every day | ORAL | Status: DC
Start: 2022-02-19 — End: 2022-02-20
  Administered 2022-02-19: 1 via ORAL
  Filled 2022-02-18: qty 1

## 2022-02-18 MED ORDER — BENZOCAINE-MENTHOL 20-0.5 % EX AERO: 1.0000 | INHALATION_SPRAY | CUTANEOUS | Status: DC | PRN

## 2022-02-18 MED ORDER — SODIUM CHLORIDE 0.9 % IV SOLN
5.0000 10*6.[IU] | Freq: Once | INTRAVENOUS | Status: AC
  Administered 2022-02-18: 5 10*6.[IU] via INTRAVENOUS
  Filled 2022-02-18: qty 5

## 2022-02-18 MED ORDER — OXYCODONE HCL 5 MG PO TABS: 10.0000 mg | ORAL_TABLET | ORAL | Status: DC | PRN

## 2022-02-18 MED ORDER — FENTANYL CITRATE (PF) 100 MCG/2ML IJ SOLN
50.0000 ug | INTRAMUSCULAR | Status: DC | PRN
  Administered 2022-02-18: 100 ug via INTRAVENOUS
  Filled 2022-02-18: qty 2

## 2022-02-18 MED ORDER — LACTATED RINGERS IV SOLN: INTRAVENOUS | Status: DC

## 2022-02-18 MED ORDER — PHENYLEPHRINE 80 MCG/ML (10ML) SYRINGE FOR IV PUSH (FOR BLOOD PRESSURE SUPPORT)
80.0000 ug | PREFILLED_SYRINGE | INTRAVENOUS | Status: DC | PRN
  Filled 2022-02-18: qty 10

## 2022-02-18 MED ORDER — OXYTOCIN BOLUS FROM INFUSION
333.0000 mL | Freq: Once | INTRAVENOUS | Status: AC
  Administered 2022-02-18: 333 mL via INTRAVENOUS

## 2022-02-18 MED ORDER — TERBUTALINE SULFATE 1 MG/ML IJ SOLN: 0.2500 mg | Freq: Once | INTRAMUSCULAR | Status: DC | PRN

## 2022-02-18 MED ORDER — DIPHENHYDRAMINE HCL 25 MG PO CAPS: 25.0000 mg | ORAL_CAPSULE | Freq: Four times a day (QID) | ORAL | Status: DC | PRN

## 2022-02-18 MED ORDER — SOD CITRATE-CITRIC ACID 500-334 MG/5ML PO SOLN: 30.0000 mL | ORAL | Status: DC | PRN

## 2022-02-18 MED ORDER — ONDANSETRON HCL 4 MG PO TABS: 4.0000 mg | ORAL_TABLET | ORAL | Status: DC | PRN

## 2022-02-18 MED ORDER — DIPHENHYDRAMINE HCL 50 MG/ML IJ SOLN: 12.5000 mg | INTRAMUSCULAR | Status: DC | PRN

## 2022-02-18 MED ORDER — ACETAMINOPHEN 325 MG PO TABS: 650.0000 mg | ORAL_TABLET | ORAL | Status: DC | PRN

## 2022-02-18 MED ORDER — FENTANYL-BUPIVACAINE-NACL 0.5-0.125-0.9 MG/250ML-% EP SOLN
12.0000 mL/h | EPIDURAL | Status: DC | PRN
  Administered 2022-02-18: 12 mL/h via EPIDURAL
  Filled 2022-02-18: qty 250

## 2022-02-18 NOTE — Progress Notes (Signed)
Patient declined [employer] enrollment. 

## 2022-02-18 NOTE — Anesthesia Preprocedure Evaluation (Signed)
Anesthesia Evaluation  Patient identified by MRN, date of birth, ID band Patient awake    Reviewed: Allergy & Precautions, Patient's Chart, lab work & pertinent test results  Airway Mallampati: II  TM Distance: >3 FB Neck ROM: Full    Dental no notable dental hx. (+) Teeth Intact   Pulmonary neg pulmonary ROS,    Pulmonary exam normal breath sounds clear to auscultation       Cardiovascular negative cardio ROS Normal cardiovascular exam Rhythm:Regular Rate:Normal     Neuro/Psych  Headaches, negative psych ROS   GI/Hepatic Neg liver ROS, GERD  ,  Endo/Other  Obesity  Renal/GU negative Renal ROS  negative genitourinary   Musculoskeletal negative musculoskeletal ROS (+)   Abdominal (+) + obese,   Peds  Hematology  (+) Blood dyscrasia, anemia ,   Anesthesia Other Findings   Reproductive/Obstetrics (+) Pregnancy                             Anesthesia Physical Anesthesia Plan  ASA: 2  Anesthesia Plan: Epidural   Post-op Pain Management:    Induction:   PONV Risk Score and Plan:   Airway Management Planned: Natural Airway  Additional Equipment:   Intra-op Plan:   Post-operative Plan:   Informed Consent: I have reviewed the patients History and Physical, chart, labs and discussed the procedure including the risks, benefits and alternatives for the proposed anesthesia with the patient or authorized representative who has indicated his/her understanding and acceptance.       Plan Discussed with: Anesthesiologist  Anesthesia Plan Comments:         Anesthesia Quick Evaluation

## 2022-02-18 NOTE — H&P (Signed)
Michelle Ortiz is a 31 y.o. female G4W1027 [redacted]w[redacted]d presenting for elective IOL. She reports no LOF, VB, Contractions. Normal FM.   Pregnancy c/b: History of rapid labor with last pregnancy and delivery at home Obesity; initial BMI 32  Office growth scan 7/5:   EFW 6lb 8oz (48%), fluid normal, cephalic Pelvis proven to 7lb8oz  OB History     Gravida  3   Para  2   Term  2   Preterm      AB      Living  2      SAB      IAB      Ectopic      Multiple  0   Live Births  2          Past Medical History:  Diagnosis Date   Headache(784.0) HEADACHES AS TEEN NONE IN 3 YEARS   Migraine    Past Surgical History:  Procedure Laterality Date   ANKLE RECONSTRUCTION  10/03/2011   Procedure: RECONSTRUCTION ANKLE;  Surgeon: Toni Arthurs, MD;  Location: [facility];  Service: Orthopedics;  Laterality: Left;  left ankle modified brostrum lateral ligament reconstruction   ROOT CANAL     WISDOM TOOTH EXTRACTION     Family History: family history includes Hypertension in her father and mother; Thyroid disease in her mother and sister. Social History:  reports that she has never smoked. She has never used smokeless tobacco. She reports that she does not drink alcohol and does not use drugs.     Maternal Diabetes: No Genetic Screening: Declined Maternal Ultrasounds/Referrals: Normal Fetal Ultrasounds or other Referrals:  None Maternal Substance Abuse:  No Significant Maternal Medications:  None Significant Maternal Lab Results:  Group B Strep positive Other Comments:  None  Review of Systems Per HPI Exam Physical Exam  Dilation: 1 Effacement (%): Thick Station: -3 Exam by:: k fields, rn Blood pressure 125/90, pulse 86, temperature 98.1 F (36.7 C), temperature source Oral, resp. rate 16, height 5\' 9"  (1.753 m), weight 110.9 kg, last menstrual period 05/19/2021, unknown if currently breastfeeding. Gen: NAD, resting comfortably CVS: normal pulses Lungs:  nonlabored respirations\ Abd: Gravid abdomen, Le[place] 7lbs Ext: no calf edema or tenderness  Fetal testing: 125bpm, mod variability, + accels, no decels Toco: ctx irregular Prenatal labs: ABO, Rh:  --/--/B POS (07/24 03-15-2005) Antibody: NEG (07/24 0714) Rubella: Immune (01/11 0000) RPR: Nonreactive (01/11 0000)  HBsAg: Negative (01/11 0000)  HIV: Non-reactive (01/11 0000)  GBS: Positive/-- (07/05 0000)   Assessment/Plan: 30Y 02-11-1969 @ [redacted]w[redacted]d, elective IOL Fetal wellbeing: cat I tracing IOL: pitocin running, plan AROM once fetal vertex engaged and adequate penicillin GBS+: penicillin Pain control: undecided about epidural at this time   [redacted]w[redacted]d 02/18/2022, 9:21 AM

## 2022-02-18 NOTE — Anesthesia Procedure Notes (Signed)
Epidural Patient location during procedure: OB Start time: 02/18/2022 3:00 PM End time: 02/18/2022 3:08 PM  Staffing Anesthesiologist: Mal Amabile, MD Performed: anesthesiologist   Preanesthetic Checklist Completed: patient identified, IV checked, site marked, risks and benefits discussed, surgical consent, monitors and equipment checked, pre-op evaluation and timeout performed  Epidural Patient position: sitting Prep: DuraPrep and site prepped and draped Patient monitoring: continuous pulse ox and blood pressure Approach: midline Location: L3-L4 Injection technique: LOR air  Needle:  Needle type: Tuohy  Needle gauge: 17 G Needle length: 9 cm and 9 Needle insertion depth: 6 cm Catheter type: closed end flexible Catheter size: 19 Gauge Catheter at skin depth: 11 cm Test dose: negative and Other  Assessment Events: blood not aspirated, injection not painful, no injection resistance, no paresthesia and negative IV test  Additional Notes Patient identified. Risks and benefits discussed including failed block, incomplete  Pain control, post dural puncture headache, nerve damage, paralysis, blood pressure Changes, nausea, vomiting, reactions to medications-both toxic and allergic and post Partum back pain. All questions were answered. Patient expressed understanding and wished to proceed. Sterile technique was used throughout procedure. Epidural site was Dressed with sterile barrier dressing. No paresthesias, signs of intravascular injection Or signs of intrathecal spread were encountered.  Patient was more comfortable after the epidural was dosed. Please see RN's note for documentation of vital signs and FHR which are stable. Reason for block:procedure for pain

## 2022-02-18 NOTE — Lactation Note (Signed)
This note was copied from a baby's chart. Lactation Consultation Note Mom is Breast/formula. Declines the need for Lactation.  Patient Name: Michelle Ortiz [id number] Date: 02/18/2022   Age:30 hours  Maternal Data    Feeding    LATCH Score                    Lactation Tools Discussed/Used    Interventions    Discharge    Consult Status Consult Status: Complete    Charyl Dancer 02/18/2022, 10:06 PM

## 2022-02-19 LAB — CBC
HCT: 31.1 % — ABNORMAL LOW (ref 36.0–46.0)
Hemoglobin: 10.3 g/dL — ABNORMAL LOW (ref 12.0–15.0)
MCH: 28.6 pg (ref 26.0–34.0)
MCHC: 33.1 g/dL (ref 30.0–36.0)
MCV: 86.4 fL (ref 80.0–100.0)
Platelets: 175 10*3/uL (ref 150–400)
RBC: 3.6 MIL/uL — ABNORMAL LOW (ref 3.87–5.11)
RDW: 13.8 % (ref 11.5–15.5)
WBC: 10.6 10*3/uL — ABNORMAL HIGH (ref 4.0–10.5)
nRBC: 0 % (ref 0.0–0.2)

## 2022-02-19 LAB — BIRTH TISSUE RECOVERY COLLECTION (PLACENTA DONATION)

## 2022-02-19 MED ORDER — IBUPROFEN 600 MG PO TABS: 600.0000 mg | ORAL_TABLET | Freq: Four times a day (QID) | ORAL | 0 refills | Status: DC

## 2022-02-19 NOTE — Discharge Instructions (Signed)
As per discharge pamphlet °

## 2022-02-19 NOTE — Progress Notes (Signed)
PPD #1 No problems, would like to go home this evening Afeb, VSS Fundus firm, NT at U-1 Continue routine postpartum care, d/c home this pm unless baby unable to go

## 2022-02-19 NOTE — Anesthesia Postprocedure Evaluation (Signed)
Anesthesia Post Note  Patient: Michelle Ortiz  Procedure(s) Performed: AN AD HOC LABOR EPIDURAL     Patient location during evaluation: Mother Baby Anesthesia Type: Epidural Level of consciousness: awake Pain management: satisfactory to patient Vital Signs Assessment: post-procedure vital signs reviewed and stable Respiratory status: spontaneous breathing Cardiovascular status: stable Anesthetic complications: no   No notable events documented.  Last Vitals:  Vitals:   02/19/22 0215 02/19/22 0557  BP: 124/76 110/84  Pulse: 72 84  Resp: 20 20  Temp: 36.8 C 36.9 C  SpO2: 100% 100%    Last Pain:  Vitals:   02/19/22 0730  TempSrc:   PainSc: Asleep   Pain Goal:                   [employer]

## 2022-02-19 NOTE — Discharge Summary (Signed)
Postpartum Discharge Summary      Patient Name: Michelle Ortiz DOB: 1991-07-03 MRN: [id number]  Date of admission: 02/18/2022 Delivery date:02/18/2022  Delivering provider: Derl Barrow E  Date of discharge: 02/19/2022  Admitting diagnosis: [redacted] weeks gestation of pregnancy [Z3A.39] Intrauterine pregnancy: [redacted]w[redacted]d     Secondary diagnosis:  Principal Problem:   [redacted] weeks gestation of pregnancy    Discharge diagnosis: Term Pregnancy Delivered                                               Hospital course: Induction of Labor With Vaginal Delivery   31 y.o. yo G3P3003 at [redacted]w[redacted]d was admitted to the hospital 02/18/2022 for induction of labor.  Indication for induction: Favorable cervix at term.  Patient had an uncomplicated labor course as follows: Membrane Rupture Time/Date: 12:30 PM ,02/18/2022   Delivery Method:Vaginal, Spontaneous  Episiotomy: None  Lacerations:  2nd degree  Details of delivery can be found in separate delivery note.  Patient had a routine postpartum course. Patient is discharged home 02/19/22.  Newborn Data: Birth date:02/18/2022  Birth time:3:44 PM  Gender:Female  Living status:Living  Apgars:9 ,9  Weight:3475 g    Physical exam  Vitals:   02/18/22 1814 02/18/22 2055 02/19/22 0215 02/19/22 0557  BP: 122/86 121/73 124/76 110/84  Pulse: 70 68 72 84  Resp:  16 20 20   Temp:  98.2 F (36.8 C) 98.3 F (36.8 C) 98.5 F (36.9 C)  TempSrc:  Oral Oral Oral  SpO2:  99% 100% 100%  Weight:      Height:       General: alert Lochia: appropriate Uterine Fundus: firm  Labs: Lab Results  Component Value Date   WBC 10.6 (H) 02/19/2022   HGB 10.3 (L) 02/19/2022   HCT 31.1 (L) 02/19/2022   MCV 86.4 02/19/2022   PLT 175 02/19/2022      Latest Ref Rng & Units 06/27/2021    8:01 PM  CMP  Glucose 70 - 99 mg/dL 93   BUN 6 - 20 mg/dL 7   Creatinine 06/29/2021 - 7.85 mg/dL 8.85   Sodium 0.27 - 741 mmol/L 135   Potassium 3.5 - 5.1 mmol/L 3.5   Chloride 98 - 111 mmol/L  104   CO2 22 - 32 mmol/L 22   Calcium 8.9 - 10.3 mg/dL 8.7   Total Protein 6.5 - 8.1 g/dL 6.5   Total Bilirubin 0.3 - 1.2 mg/dL 0.5   Alkaline Phos 38 - 126 U/L 50   AST 15 - 41 U/L 19   ALT 0 - 44 U/L 19    Edinburgh Score:    02/18/2022    6:14 PM  Edinburgh Postnatal Depression Scale Screening Tool  I have been able to laugh and see the funny side of things. 0  I have looked forward with enjoyment to things. 0  I have blamed myself unnecessarily when things went wrong. 2  I have been anxious or worried for no good reason. 2  I have felt scared or panicky for no good reason. 2  Things have been getting on top of me. 1  I have been so unhappy that I have had difficulty sleeping. 0  I have felt sad or miserable. 0  I have been so unhappy that I have been crying. 0  The thought of harming myself has occurred  to me. 0  Edinburgh Postnatal Depression Scale Total 7      After visit meds:  Allergies as of 02/19/2022       Reactions   Tape Rash   Adhesive tape        Medication List     TAKE these medications    cyclobenzaprine 5 MG tablet Commonly known as: FLEXERIL Take 1-2 tablets (5-10 mg total) by mouth 3 (three) times daily as needed for muscle spasms. What changed:  how much to take when to take this reasons to take this   ibuprofen 600 MG tablet Commonly known as: ADVIL Take 1 tablet (600 mg total) by mouth every 6 (six) hours.   PRENATAL VITAMIN PO Take 1 tablet by mouth at bedtime.         Discharge home in stable condition Infant Feeding: Breast Infant Disposition:home with mother Discharge instruction: per After Visit Summary and Postpartum booklet. Activity: Advance as tolerated. Pelvic rest for 6 weeks.  Diet: routine diet Postpartum Appointment:4 weeks Follow up Visit:  Follow-up Information     [name], MD. Schedule an appointment as soon as possible for a visit in 4 week(s).   Specialty: Obstetrics and  Gynecology Contact information: [address] [place] [address] [phone]                     02/19/2022 Zenaida Niece, MD

## 2022-02-19 NOTE — Lactation Note (Signed)
This note was copied from a baby's chart. Lactation Consultation Note  Patient Name: Michelle Ortiz [id number] Date: 02/19/2022 Reason for consult: Initial assessment;Term Age:30 hours   LC Note:  Mother declined a lactation consult until today; she had questions regarding flange size.  Mother did not have her personal pump present so I verbally discussed how to assess for correct flange size   Suggested she use expressed breast milk for nipple care and coconut oil as needed.  Mother appreciative.   Maternal Data    Feeding Mother's Current Feeding Choice: Breast Milk  LATCH Score                    Lactation Tools Discussed/Used    Interventions    Discharge    Consult Status Consult Status: Complete    Michelle Ortiz R Deloras Reichard 02/19/2022, 11:44 AM

## 2022-02-27 ENCOUNTER — Telehealth (HOSPITAL_COMMUNITY): Payer: Self-pay | Admitting: *Deleted

## 2022-02-27 NOTE — Telephone Encounter (Signed)
Left phone voicemail message.  Duffy Rhody, RN 02-27-2022 at 1:30pm

## 2022-08-13 ENCOUNTER — Ambulatory Visit
Admission: EM | Admit: 2022-08-13 | Discharge: 2022-08-13 | Disposition: A | Discharge status: Home / Self Care | Payer: BC Managed Care – PPO | Attending: Urgent Care | Admitting: Urgent Care

## 2022-08-13 DIAGNOSIS — L03112 Cellulitis of left axilla: Secondary | ICD-10-CM | POA: Diagnosis not present

## 2022-08-13 MED ORDER — FLUCONAZOLE 150 MG PO TABS: 150.0000 mg | ORAL_TABLET | ORAL | 0 refills | Status: DC

## 2022-08-13 MED ORDER — NAPROXEN 500 MG PO TABS: 500.0000 mg | ORAL_TABLET | Freq: Two times a day (BID) | ORAL | 0 refills | Status: DC

## 2022-08-13 MED ORDER — DOXYCYCLINE HYCLATE 100 MG PO CAPS: 100.0000 mg | ORAL_CAPSULE | Freq: Two times a day (BID) | ORAL | 0 refills | Status: DC

## 2022-08-13 NOTE — Discharge Instructions (Addendum)
Please change your dressing 3-5 times daily. Do not apply any ointments or creams.  Start doxycycline for the infection. Use naproxen for the pain.

## 2022-08-13 NOTE — ED Provider Notes (Signed)
[facility]  Note:  This document was prepared using Systems analyst and may include unintentional dictation errors.  MRN: [id number] DOB: 1991/06/01  Subjective:   Karolynn Infantino is a 32 y.o. female presenting for 2 week history of persistent intermittent painful nodules in the left axillary region.  Patient has been getting them to resolve with warm compresses.  Unfortunately this particular episode is not resolving as well.  No fever, drainage of pus or bleeding.  No history of hidradenitis.   No current facility-administered medications for this encounter.  Current Outpatient Medications:    cyclobenzaprine (FLEXERIL) 5 MG tablet, Take 1-2 tablets (5-10 mg total) by mouth 3 (three) times daily as needed for muscle spasms. (Patient taking differently: Take 5 mg by mouth 2 (two) times daily as needed for muscle spasms (back pain).), Disp: 15 tablet, Rfl: 1   ibuprofen (ADVIL) 600 MG tablet, Take 1 tablet (600 mg total) by mouth every 6 (six) hours., Disp: 30 tablet, Rfl: 0   Prenatal Vit-Fe Fumarate-FA (PRENATAL VITAMIN PO), Take 1 tablet by mouth at bedtime., Disp: , Rfl:    Allergies  Allergen Reactions   Tape Rash    Adhesive tape    Past Medical History:  Diagnosis Date   Headache(784.0) HEADACHES AS TEEN NONE IN 3 YEARS   Migraine      Past Surgical History:  Procedure Laterality Date   ANKLE RECONSTRUCTION  10/03/2011   Procedure: RECONSTRUCTION ANKLE;  Surgeon: Wylene Simmer, MD;  Location: [facility];  Service: Orthopedics;  Laterality: Left;  left ankle modified brostrum lateral ligament reconstruction   ROOT CANAL     WISDOM TOOTH EXTRACTION      Family History  Problem Relation Age of Onset   Hypertension Mother    Thyroid disease Mother    Hypertension Father    Thyroid disease Sister     Social History   Tobacco Use   Smoking status: Never   Smokeless tobacco: Never  Vaping Use   Vaping Use: Never  used  Substance Use Topics   Alcohol use: No   Drug use: No    ROS   Objective:   Vitals: BP 125/85 (BP Location: Right Arm)   Pulse 79   Temp 98.8 F (37.1 C) (Oral)   Resp 18   LMP 07/30/2022 (Approximate) Comment: reec  SpO2 98%   Breastfeeding No   Physical Exam Constitutional:      General: She is not in acute distress.    Appearance: Normal appearance. She is well-developed. She is not ill-appearing, toxic-appearing or diaphoretic.  HENT:     Head: Normocephalic and atraumatic.     Nose: Nose normal.     Mouth/Throat:     Mouth: Mucous membranes are moist.  Eyes:     General: No scleral icterus.       Right eye: No discharge.        Left eye: No discharge.     Extraocular Movements: Extraocular movements intact.  Cardiovascular:     Rate and Rhythm: Normal rate.  Pulmonary:     Effort: Pulmonary effort is normal.  Skin:    General: Skin is warm and dry.       Neurological:     General: No focal deficit present.     Mental Status: She is alert and oriented to person, place, and time.  Psychiatric:        Mood and Affect: Mood normal.  Behavior: Behavior normal.     Assessment and Plan :   PDMP not reviewed this encounter.  1. Cellulitis of axilla, left     Area is not amenable to incision and drainage given the frank induration and no fluctuance.  Recommended starting doxycycline, continue warm compresses.  Use naproxen for pain and inflammation.  Start fluconazole for secondary yeast infection from antibiotic use.  If patient continues to remain symptomatic, discussed evaluating for incision and drainage in 3 days. Counseled patient on potential for adverse effects with medications prescribed/recommended today, ER and return-to-clinic precautions discussed, patient verbalized understanding.    Jaynee Eagles, [place] 08/13/22 0867

## 2022-08-13 NOTE — ED Triage Notes (Signed)
Pt c/o abscess to left axillary region.  Home interventions: hot compress

## 2023-01-06 ENCOUNTER — Other Ambulatory Visit: Payer: Self-pay

## 2023-01-06 ENCOUNTER — Encounter (HOSPITAL_BASED_OUTPATIENT_CLINIC_OR_DEPARTMENT_OTHER): Payer: Self-pay | Admitting: Obstetrics and Gynecology

## 2023-01-06 NOTE — Progress Notes (Signed)
Spoke w/ via phone for pre-op interview---pt Lab needs dos----   cbc, T & S, urine preg            Lab results------none COVID test -----patient states asymptomatic no test needed Arrive at -------530 am 01-23-2023 NPO after MN NO Solid Food.  Clear liquids from MN until---430 am Med rec completed Medications to take morning of surgery -----Wellbutrin Diabetic medication -----n/a Patient instructed no nail polish to be worn day of surgery Patient instructed to bring photo id and insurance card day of surgery Patient aware to have Driver (ride ) / caregiver    partner for 24 hours after surgery  Patient Special Instructions -----none Pre-Op special Instructions -----none Patient verbalized understanding of instructions that were given at this phone interview. Patient denies shortness of breath, chest pain, fever, cough at this phone interview.

## 2023-01-22 NOTE — H&P (Signed)
Michelle Ortiz is an 32 y.o. female P3 with nexplanon in place and desire for permanent sterilization.   Pertinent Gynecological History: Menses: regular monthly Bleeding: light Contraception: Nexplanon DES exposure: denies Blood transfusions: none Sexually transmitted diseases: no past history Previous GYN Procedures:  none   Last mammogram: n/a Last pap: normal Date: 05/24/20 OB History: G3, P3   Menstrual History:  Patient's last menstrual period was 12/24/2022.    Past Medical History:  Diagnosis Date   Anemia    Headache(784.0)    migraine   Migraine    Varicose veins of both lower extremities, unspecified whether complicated     Past Surgical History:  Procedure Laterality Date   ANKLE RECONSTRUCTION  10/03/2011   Procedure: RECONSTRUCTION ANKLE;  Surgeon: Toni Arthurs, MD;  Location: [facility];  Service: Orthopedics;  Laterality: Left;  left ankle modified brostrum lateral ligament reconstruction   ROOT CANAL     WISDOM TOOTH EXTRACTION      Family History  Problem Relation Age of Onset   Hypertension Mother    Thyroid disease Mother    Hypertension Father    Thyroid disease Sister     Social History:  reports that she has never smoked. She has never used smokeless tobacco. She reports that she does not drink alcohol and does not use drugs.  Allergies:  Allergies  Allergen Reactions   Tape Rash    Adhesive tape    No medications prior to admission.    Review of Systems  Constitutional:  Negative for fever.  HENT:  Negative for sore throat.   Eyes:  Negative for visual disturbance.  Respiratory:  Negative for shortness of breath.   Cardiovascular:  Negative for chest pain.  Gastrointestinal:  Negative for abdominal pain.  Genitourinary:  Negative for vaginal bleeding.  Musculoskeletal:  Negative for neck pain.  Skin:  Negative for rash.  Neurological:  Negative for headaches.  Psychiatric/Behavioral:  Negative for suicidal ideas.      Height 5\' 9"  (1.753 m), weight 99.8 kg, last menstrual period 12/24/2022, not currently breastfeeding. Physical Exam Chaperone Chaperone: present  Constitutional General Appearance: healthy-appearing, well-nourished, well-developed  Cardiovascular Auscultation: RRR, no murmur Peripheral Vascular: no varicosities, no edema  Lungs Respiratory Effort: no accessory muscle usage, no intercostal retractions Auscultation: clear to auscultation, no wheezing, no rales/crackles, no rhonchi Inspection: normal, normal respiratory rate  Breast Bilateral: no skin changes, nipple appearance: normal, no abnormal nipple secretions, no tenderness, no masses palpable Right Breast: normal Left Breast: normal  Abdomen Inspection/Palpation/Auscultation: soft, non-distended, no tenderness  Female Genitalia Mons: normal, no erythema, no excoriation, no atrophy, no lesions, no vesicles/ ulcers, no masses, no swelling, no tenderness Labia Majora: normal, no erythema, no excoriation, no atrophy, no discoloration, no lesions, no vesicles/ ulcers, no masses, no swelling, no tenderness Labia Minora: normal, no erythema, no excoriation, no atrophy, no discoloration, no lesions, no vesicles, no masses, no swelling, no tenderness Introitus: normal Vagina: normal, no discharge, no blood present, no erythema, no atrophy, no lesions, no ulcers, no swelling, no masses, no tenderness, no prolapse Cervix: grossly normal, no lesions, no discharge, no bleeding, no cervical motion tenderness Uterus: normal size, normal contour, midline, no uterine prolapse, mobile, non-tender Urethral Meatus/ Urethra normal meatus Adnexa/Parametria: no mass palpable, no tenderness  Rectum Anus & Perineum: normal perineum  Extremities Legs: normal Arms: normal Pulses: normal  Neurological System Impressions motor: no deficits, sensory: no deficits  Psychiatric Orientation: to person, to place, to time Mood and Affect:  active and alert, normal mood, normal affect No results found for this or any previous visit (from the past 24 hour(s)).  No results found.  Assessment/Plan: 31Y P3 with desire for permanent sterilization and nexplanon removal - Plan: laparoscopic bilateral salpingectomy, Nexplanon removal - Informed consent obtained.  Risks discussed including infection, bleeding, damage to surrounding organs, laparotomy, failure to attain desired results. All questions answered.     [name] 01/22/2023, 2:39 PM

## 2023-01-23 ENCOUNTER — Encounter (HOSPITAL_BASED_OUTPATIENT_CLINIC_OR_DEPARTMENT_OTHER)
Admission: RE | Disposition: A | Discharge status: Home / Self Care | Payer: Self-pay | Source: Home / Self Care | Attending: Obstetrics and Gynecology

## 2023-01-23 ENCOUNTER — Encounter (HOSPITAL_BASED_OUTPATIENT_CLINIC_OR_DEPARTMENT_OTHER): Payer: Self-pay | Admitting: Obstetrics and Gynecology

## 2023-01-23 ENCOUNTER — Ambulatory Visit (HOSPITAL_BASED_OUTPATIENT_CLINIC_OR_DEPARTMENT_OTHER): Payer: BC Managed Care – PPO | Admitting: Anesthesiology

## 2023-01-23 ENCOUNTER — Other Ambulatory Visit: Payer: Self-pay

## 2023-01-23 ENCOUNTER — Ambulatory Visit (HOSPITAL_BASED_OUTPATIENT_CLINIC_OR_DEPARTMENT_OTHER)
Admission: RE | Admit: 2023-01-23 | Discharge: 2023-01-23 | Disposition: A | Discharge status: Home / Self Care | Payer: BC Managed Care – PPO | Attending: Obstetrics and Gynecology | Admitting: Obstetrics and Gynecology

## 2023-01-23 DIAGNOSIS — Z302 Encounter for sterilization: Secondary | ICD-10-CM | POA: Insufficient documentation

## 2023-01-23 DIAGNOSIS — R519 Headache, unspecified: Secondary | ICD-10-CM | POA: Insufficient documentation

## 2023-01-23 DIAGNOSIS — X58XXXA Exposure to other specified factors, initial encounter: Secondary | ICD-10-CM | POA: Insufficient documentation

## 2023-01-23 DIAGNOSIS — S3763XA Laceration of uterus, initial encounter: Secondary | ICD-10-CM | POA: Insufficient documentation

## 2023-01-23 DIAGNOSIS — Z01818 Encounter for other preprocedural examination: Secondary | ICD-10-CM

## 2023-01-23 HISTORY — DX: Asymptomatic varicose veins of bilateral lower extremities: I83.93

## 2023-01-23 HISTORY — DX: Anemia, unspecified: D64.9

## 2023-01-23 HISTORY — PX: LAPAROSCOPIC BILATERAL SALPINGECTOMY: SHX5889

## 2023-01-23 HISTORY — PX: REMOVAL OF NON VAGINAL CONTRACEPTIVE DEVICE: SHX6219

## 2023-01-23 LAB — CBC
HCT: 44.1 % (ref 36.0–46.0)
Hemoglobin: 14.4 g/dL (ref 12.0–15.0)
MCH: 28 pg (ref 26.0–34.0)
MCHC: 32.7 g/dL (ref 30.0–36.0)
MCV: 85.8 fL (ref 80.0–100.0)
Platelets: 245 10*3/uL (ref 150–400)
RBC: 5.14 MIL/uL — ABNORMAL HIGH (ref 3.87–5.11)
RDW: 12.8 % (ref 11.5–15.5)
WBC: 5 10*3/uL (ref 4.0–10.5)
nRBC: 0 % (ref 0.0–0.2)

## 2023-01-23 LAB — TYPE AND SCREEN
ABO/RH(D): B POS
Antibody Screen: NEGATIVE

## 2023-01-23 LAB — POCT PREGNANCY, URINE: Preg Test, Ur: NEGATIVE

## 2023-01-23 SURGERY — SALPINGECTOMY, BILATERAL, LAPAROSCOPIC
Anesthesia: General | Site: Arm Upper | Laterality: Left

## 2023-01-23 MED ORDER — ACETAMINOPHEN 500 MG PO TABS
ORAL_TABLET | ORAL | Status: AC
  Filled 2023-01-23: qty 2

## 2023-01-23 MED ORDER — LIDOCAINE 2% (20 MG/ML) 5 ML SYRINGE
INTRAMUSCULAR | Status: DC | PRN
  Administered 2023-01-23: 40 mg via INTRAVENOUS

## 2023-01-23 MED ORDER — ACETAMINOPHEN 325 MG PO TABS: 650.0000 mg | ORAL_TABLET | Freq: Four times a day (QID) | ORAL | Status: AC | PRN

## 2023-01-23 MED ORDER — OXYCODONE HCL 5 MG/5ML PO SOLN: 5.0000 mg | Freq: Once | ORAL | Status: DC | PRN

## 2023-01-23 MED ORDER — DEXMEDETOMIDINE HCL IN NACL 80 MCG/20ML IV SOLN
INTRAVENOUS | Status: AC
  Filled 2023-01-23: qty 20

## 2023-01-23 MED ORDER — SILVER NITRATE-POT NITRATE 75-25 % EX MISC
CUTANEOUS | Status: DC | PRN
  Administered 2023-01-23: 2

## 2023-01-23 MED ORDER — PROPOFOL 10 MG/ML IV BOLUS
INTRAVENOUS | Status: DC | PRN
  Administered 2023-01-23: 200 mg via INTRAVENOUS

## 2023-01-23 MED ORDER — ROCURONIUM BROMIDE 10 MG/ML (PF) SYRINGE
PREFILLED_SYRINGE | INTRAVENOUS | Status: DC | PRN
  Administered 2023-01-23: 50 mg via INTRAVENOUS

## 2023-01-23 MED ORDER — DEXAMETHASONE SODIUM PHOSPHATE 10 MG/ML IJ SOLN
INTRAMUSCULAR | Status: DC | PRN
  Administered 2023-01-23: 10 mg via INTRAVENOUS

## 2023-01-23 MED ORDER — DEXMEDETOMIDINE HCL IN NACL 80 MCG/20ML IV SOLN
INTRAVENOUS | Status: DC | PRN
  Administered 2023-01-23: 12 ug via INTRAVENOUS

## 2023-01-23 MED ORDER — MIDAZOLAM HCL 2 MG/2ML IJ SOLN
INTRAMUSCULAR | Status: DC | PRN
  Administered 2023-01-23: 2 mg via INTRAVENOUS

## 2023-01-23 MED ORDER — AMISULPRIDE (ANTIEMETIC) 5 MG/2ML IV SOLN: 10.0000 mg | Freq: Once | INTRAVENOUS | Status: DC | PRN

## 2023-01-23 MED ORDER — FENTANYL CITRATE (PF) 100 MCG/2ML IJ SOLN
INTRAMUSCULAR | Status: AC
  Filled 2023-01-23: qty 2

## 2023-01-23 MED ORDER — DEXAMETHASONE SODIUM PHOSPHATE 10 MG/ML IJ SOLN
INTRAMUSCULAR | Status: AC
  Filled 2023-01-23: qty 2

## 2023-01-23 MED ORDER — MIDAZOLAM HCL 2 MG/2ML IJ SOLN
INTRAMUSCULAR | Status: AC
  Filled 2023-01-23: qty 2

## 2023-01-23 MED ORDER — OXYCODONE HCL 5 MG PO TABS: 5.0000 mg | ORAL_TABLET | Freq: Once | ORAL | Status: DC | PRN

## 2023-01-23 MED ORDER — ACETAMINOPHEN 325 MG PO TABS: 325.0000 mg | ORAL_TABLET | ORAL | Status: DC | PRN

## 2023-01-23 MED ORDER — BUPIVACAINE HCL (PF) 0.25 % IJ SOLN
INTRAMUSCULAR | Status: DC | PRN
  Administered 2023-01-23: 3 mL
  Administered 2023-01-23: 10 mL

## 2023-01-23 MED ORDER — LACTATED RINGERS IV SOLN: INTRAVENOUS | Status: DC

## 2023-01-23 MED ORDER — ONDANSETRON HCL 4 MG/2ML IJ SOLN
INTRAMUSCULAR | Status: DC | PRN
  Administered 2023-01-23: 4 mg via INTRAVENOUS

## 2023-01-23 MED ORDER — ONDANSETRON HCL 4 MG/2ML IJ SOLN
INTRAMUSCULAR | Status: AC
  Filled 2023-01-23: qty 4

## 2023-01-23 MED ORDER — PROPOFOL 10 MG/ML IV BOLUS
INTRAVENOUS | Status: AC
  Filled 2023-01-23: qty 20

## 2023-01-23 MED ORDER — LIDOCAINE HCL (PF) 2 % IJ SOLN
INTRAMUSCULAR | Status: AC
  Filled 2023-01-23: qty 10

## 2023-01-23 MED ORDER — 0.9 % SODIUM CHLORIDE (POUR BTL) OPTIME
TOPICAL | Status: DC | PRN
  Administered 2023-01-23: 500 mL

## 2023-01-23 MED ORDER — PROMETHAZINE HCL 25 MG/ML IJ SOLN: 6.2500 mg | INTRAMUSCULAR | Status: DC | PRN

## 2023-01-23 MED ORDER — SCOPOLAMINE 1 MG/3DAYS TD PT72
1.0000 | MEDICATED_PATCH | TRANSDERMAL | Status: DC
  Administered 2023-01-23: 1 via TRANSDERMAL

## 2023-01-23 MED ORDER — KETOROLAC TROMETHAMINE 30 MG/ML IJ SOLN
INTRAMUSCULAR | Status: AC
  Filled 2023-01-23: qty 1

## 2023-01-23 MED ORDER — SUGAMMADEX SODIUM 200 MG/2ML IV SOLN
INTRAVENOUS | Status: DC | PRN
  Administered 2023-01-23: 200 mg via INTRAVENOUS

## 2023-01-23 MED ORDER — HEMOSTATIC AGENTS (NO CHARGE) OPTIME
TOPICAL | Status: DC | PRN
  Administered 2023-01-23: 1 via TOPICAL

## 2023-01-23 MED ORDER — SCOPOLAMINE 1 MG/3DAYS TD PT72
MEDICATED_PATCH | TRANSDERMAL | Status: AC
  Filled 2023-01-23: qty 1

## 2023-01-23 MED ORDER — FENTANYL CITRATE (PF) 250 MCG/5ML IJ SOLN
INTRAMUSCULAR | Status: DC | PRN
  Administered 2023-01-23: 50 ug via INTRAVENOUS
  Administered 2023-01-23 (×3): 25 ug via INTRAVENOUS

## 2023-01-23 MED ORDER — ACETAMINOPHEN 160 MG/5ML PO SOLN: 325.0000 mg | ORAL | Status: DC | PRN

## 2023-01-23 MED ORDER — ACETAMINOPHEN 500 MG PO TABS
1000.0000 mg | ORAL_TABLET | ORAL | Status: AC
  Administered 2023-01-23: 1000 mg via ORAL

## 2023-01-23 MED ORDER — KETOROLAC TROMETHAMINE 30 MG/ML IJ SOLN
INTRAMUSCULAR | Status: DC | PRN
  Administered 2023-01-23: 30 mg via INTRAVENOUS

## 2023-01-23 MED ORDER — FENTANYL CITRATE (PF) 100 MCG/2ML IJ SOLN: 25.0000 ug | INTRAMUSCULAR | Status: DC | PRN

## 2023-01-23 MED ORDER — ACETAMINOPHEN 10 MG/ML IV SOLN: 1000.0000 mg | Freq: Once | INTRAVENOUS | Status: DC | PRN

## 2023-01-23 SURGICAL SUPPLY — 43 items
ADH SKN CLS APL DERMABOND .7 (GAUZE/BANDAGES/DRESSINGS) ×2
APL SRG 38 LTWT LNG FL B (MISCELLANEOUS) ×2
APPLICATOR ARISTA FLEXITIP XL (MISCELLANEOUS) IMPLANT
BNDG CMPR 5X3 CHSV STRCH STRL (GAUZE/BANDAGES/DRESSINGS) ×2
BNDG COHESIVE 3X5 TAN ST LF (GAUZE/BANDAGES/DRESSINGS) IMPLANT
DERMABOND ADVANCED .7 DNX12 (GAUZE/BANDAGES/DRESSINGS) ×2 IMPLANT
DRAPE SURG IRRIG POUCH 19X23 (DRAPES) ×2 IMPLANT
DURAPREP 26ML APPLICATOR (WOUND CARE) ×2 IMPLANT
GAUZE 4X4 16PLY ~~LOC~~+RFID DBL (SPONGE) ×2 IMPLANT
GLOVE BIO SURGEON STRL SZ 6 (GLOVE) ×2 IMPLANT
GLOVE BIOGEL PI IND STRL 6.5 (GLOVE) ×4 IMPLANT
GOWN STRL REUS W/TWL LRG LVL3 (GOWN DISPOSABLE) ×4 IMPLANT
HEMOSTAT ARISTA ABSORB 3G PWDR (HEMOSTASIS) IMPLANT
IRRIG SUCT STRYKERFLOW 2 WTIP (MISCELLANEOUS)
IRRIGATION SUCT STRKRFLW 2 WTP (MISCELLANEOUS) ×2 IMPLANT
KIT PINK PAD W/HEAD ARE REST (MISCELLANEOUS) ×2
KIT PINK PAD W/HEAD ARM REST (MISCELLANEOUS) ×2 IMPLANT
KIT TURNOVER CYSTO (KITS) ×2 IMPLANT
LIGASURE VESSEL 5MM BLUNT TIP (ELECTROSURGICAL) IMPLANT
NDL HYPO 25X1 1.5 SAFETY (NEEDLE) IMPLANT
NDL INSUFFLATION 14GA 120MM (NEEDLE) ×2 IMPLANT
NEEDLE HYPO 25X1 1.5 SAFETY (NEEDLE) ×2 IMPLANT
NEEDLE INSUFFLATION 14GA 120MM (NEEDLE) ×2 IMPLANT
NS IRRIG 1000ML POUR BTL (IV SOLUTION) ×2 IMPLANT
NS IRRIG 500ML POUR BTL (IV SOLUTION) IMPLANT
PACK LAPAROSCOPY BASIN (CUSTOM PROCEDURE TRAY) ×2 IMPLANT
PROTECTOR NERVE ULNAR (MISCELLANEOUS) ×4 IMPLANT
SET TUBE SMOKE EVAC HIGH FLOW (TUBING) ×2 IMPLANT
SLEEVE SCD COMPRESS KNEE MED (STOCKING) ×2 IMPLANT
STRIP CLOSURE SKIN 1/2X4 (GAUZE/BANDAGES/DRESSINGS) IMPLANT
SUT MNCRL AB 3-0 PS2 27 (SUTURE) ×2 IMPLANT
SUT VIC AB 2-0 CT1 (SUTURE) IMPLANT
SUT VICRYL 0 UR6 27IN ABS (SUTURE) IMPLANT
SYR 50ML LL SCALE MARK (SYRINGE) IMPLANT
SYR CONTROL 10ML LL (SYRINGE) IMPLANT
SYS BAG RETRIEVAL 10MM (BASKET)
SYSTEM BAG RETRIEVAL 10MM (BASKET) IMPLANT
SYSTEM CARTER THOMASON II (TROCAR) IMPLANT
TOWEL OR 17X24 6PK STRL BLUE (TOWEL DISPOSABLE) ×2 IMPLANT
TRAY FOLEY W/BAG SLVR 14FR LF (SET/KITS/TRAYS/PACK) ×2 IMPLANT
TROCAR Z-THREAD FIOS 11X100 BL (TROCAR) IMPLANT
TROCAR Z-THREAD FIOS 5X100MM (TROCAR) ×6 IMPLANT
WARMER LAPAROSCOPE (MISCELLANEOUS) ×2 IMPLANT

## 2023-01-23 NOTE — Op Note (Signed)
01/23/2023   8:44 AM   PATIENT:  Michelle Ortiz  32 y.o. female   PRE-OPERATIVE DIAGNOSIS:  desire for permanent sterilization, Nexplanon in place   POST-OPERATIVE DIAGNOSIS:  desire for permanent sterilization    PROCEDURE:  laparoscopic bilateral salpingectomy, Nexplanon removal, cervical laceration repair   SURGEON:  Surgeon(s) and Role:    * Caroll Cunnington, Terrance Mass, MD - Primary    * Carrington Clamp, MD - Assisting     ANESTHESIA:   local and general   EBL:  20 mL    BLOOD ADMINISTERED:none   DRAINS: Urinary Catheter (Foley)    LOCAL MEDICATIONS USED:  MARCAINE      SPECIMEN:  Source of Specimen:  bilateral fallopian tubes   DISPOSITION OF SPECIMEN:  PATHOLOGY   COUNTS:  YES   PLAN OF CARE: Discharge to home after PACU   PATIENT DISPOSITION:  PACU - hemodynamically stable.  COMPLICATIONS: none  FINDINGS: Normal appearing uterus, bilateral fallopian tubes, bilateral ovaries. Grossly normal appearing upper abdomen.   PROCEDURE IN DETAIL:   The patient was was appropriately consented and taken to the operative room where thromboguards were applied to the lower extremities and general anesthesia was obtained without difficulty. She was placed in the dorsal lithotomy position with her legs in stirrups. The patient was prepped and draped in normal sterile fashion. A foley catheter was inserted and the bladder was emptied. A speculum was placed into the vagina. The anterior lip of the cervix was grasped with a tenaculum.  A hulka uterine manipulator was introduced and the speculum was removed.    Attention was turned to the abdominal field. A 5mm vertical skin incision was made at the base of the umbilicus. A veress needle was inserted through the incision into the abdominal cavity. Correct placement was confirmed by opening pressure of . Pneumoperitoneum was established with the CO2 gas to a pressure of .The veress needle was removed and a 5mm trocar was inserted into  the abdomen cavity under direct laparoscopic visualization.  An intraabdominal survey revealed no visceral or vascular injury from entry. In the right lower quadrant, a 5mm trocar was inserted into the abdomen under direct laparoscopic visualization. In the left lower quadrant, a 5mm trocar was inserted into the abdomen under direct laparoscopic visualization.   Findings as noted above   Attention was turned to the right fallopian tube. Using the Ligasure device, the mesosalpinx was serially ligated until the right fallopian tube was completely freed. The fallopian tube was removed through the 5mm port. The same was repeated on the left side. Pneumoperitoneum pressure was reduced to and oozing was noted from the right cornua. Hemostasis was achieved with the Ligasure and Arista was applied. The area was observed under low pressure and hemostasis was confirmed.   The pneumoperitoneum was released and all instruments were removed from the abdomen. The skin incisions were closed with 3-0 monocryl suture in a subcuticular fashion. The sites were infiltrated with 0.25% marcaine. The incisions were covered with dermabond. All instruments were removed from the vagina.  An actively bleeding blood vessel was noted at the right cervical puncture site. A figure of 8 stitch of 2-0 vicryl was used to obtain hemostasis. Silver nitrate was applied to the left puncture site. Good hemostasis was achieved. The foley catheter was removed.  The left arm was untucked and prepped with betadine. A 5mm puncture was made with the scalpel at the distal end of the Nexplanon. A hemostat was used to grasp the  end of the Nexplanon and remove the device intact. The incision was infiltrated with 0.25% marcaine and steri strips applied.    The patient was awakened from general anesthesia and taken to the recovery room in stable condition.   Alinda Deem MD 01/23/23 8:52 AM

## 2023-01-23 NOTE — Discharge Instructions (Signed)

## 2023-01-23 NOTE — Anesthesia Postprocedure Evaluation (Signed)
Anesthesia Post Note  Patient: Delman Kitten  Procedure(s) Performed: LAPAROSCOPIC BILATERAL SALPINGECTOMY (Bilateral: Abdomen) REMOVAL OF NON VAGINAL CONTRACEPTIVE DEVICE (Left: Arm Upper)     Patient location during evaluation: PACU Anesthesia Type: General Level of consciousness: awake and alert Pain management: pain level controlled Vital Signs Assessment: post-procedure vital signs reviewed and stable Respiratory status: spontaneous breathing, nonlabored ventilation, respiratory function stable and patient connected to nasal cannula oxygen Cardiovascular status: blood pressure returned to baseline and stable Postop Assessment: no apparent nausea or vomiting Anesthetic complications: no  No notable events documented.  Last Vitals:  Vitals:   01/23/23 0951 01/23/23 1026  BP:  110/66  Pulse: (!) 51 (!) 54  Resp: 17 19  Temp: 36.7 C   SpO2: 100% 100%    Last Pain:  Vitals:   01/23/23 0930  TempSrc:   PainSc: (P) 2                  Shelton Silvas

## 2023-01-23 NOTE — Interval H&P Note (Signed)
History and Physical Interval Note:  01/23/2023 7:13 AM  Michelle Ortiz  has presented today for surgery, with the diagnosis of sterilization.  The various methods of treatment have been discussed with the patient and family. After consideration of risks, benefits and other options for treatment, the patient has consented to  Procedure(s): LAPAROSCOPIC BILATERAL SALPINGECTOMY (Bilateral) REMOVAL OF NON VAGINAL CONTRACEPTIVE DEVICE (N/A) as a surgical intervention.  The patient's history has been reviewed, patient examined, no change in status, stable for surgery.  I have reviewed the patient's chart and labs.  Questions were answered to the patient's satisfaction.     [name]

## 2023-01-23 NOTE — Anesthesia Preprocedure Evaluation (Addendum)
Anesthesia Evaluation  Patient identified by MRN, date of birth, ID band Patient awake    Reviewed: Allergy & Precautions, NPO status , Patient's Chart, lab work & pertinent test results  Airway Mallampati: I  TM Distance: >3 FB Neck ROM: Full    Dental  (+) Teeth Intact, Dental Advisory Given   Pulmonary neg pulmonary ROS   breath sounds clear to auscultation       Cardiovascular negative cardio ROS  Rhythm:Regular Rate:Normal     Neuro/Psych  Headaches  negative psych ROS   GI/Hepatic negative GI ROS, Neg liver ROS,,,  Endo/Other  negative endocrine ROS    Renal/GU negative Renal ROS     Musculoskeletal negative musculoskeletal ROS (+)    Abdominal   Peds  Hematology   Anesthesia Other Findings   Reproductive/Obstetrics                             Anesthesia Physical Anesthesia Plan  ASA: 2  Anesthesia Plan: General   Post-op Pain Management: Tylenol PO (pre-op)* and Toradol IV (intra-op)*   Induction: Intravenous  PONV Risk Score and Plan: 4 or greater and Ondansetron, Dexamethasone, Midazolam and Scopolamine patch - Pre-op  Airway Management Planned: Oral ETT  Additional Equipment: None  Intra-op Plan:   Post-operative Plan: Extubation in OR  Informed Consent: I have reviewed the patients History and Physical, chart, labs and discussed the procedure including the risks, benefits and alternatives for the proposed anesthesia with the patient or authorized representative who has indicated his/her understanding and acceptance.     Dental advisory given  Plan Discussed with: CRNA  Anesthesia Plan Comments:        Anesthesia Quick Evaluation

## 2023-01-23 NOTE — Anesthesia Procedure Notes (Signed)
Procedure Name: Intubation Date/Time: 01/23/2023 7:32 AM  Performed by: Dairl Ponder, CRNAPre-anesthesia Checklist: Patient identified, Emergency Drugs available, Suction available and Patient being monitored Patient Re-evaluated:Patient Re-evaluated prior to induction Oxygen Delivery Method: Circle System Utilized Preoxygenation: Pre-oxygenation with 100% oxygen Induction Type: IV induction Ventilation: Mask ventilation without difficulty Laryngoscope Size: Mac and 3 Grade View: Grade I Tube type: Oral Tube size: 7.0 mm Number of attempts: 1 Airway Equipment and Method: Stylet and Oral airway Placement Confirmation: ETT inserted through vocal cords under direct vision, positive ETCO2 and breath sounds checked- equal and bilateral Secured at: 22 cm Tube secured with: Tape Dental Injury: Teeth and Oropharynx as per pre-operative assessment

## 2023-01-23 NOTE — Transfer of Care (Signed)
Immediate Anesthesia Transfer of Care Note  Patient: Michelle Ortiz  Procedure(s) Performed: LAPAROSCOPIC BILATERAL SALPINGECTOMY (Bilateral: Abdomen) REMOVAL OF NON VAGINAL CONTRACEPTIVE DEVICE (Left: Arm Upper)  Patient Location: PACU  Anesthesia Type:General  Level of Consciousness: drowsy and patient cooperative  Airway & Oxygen Therapy: Patient Spontanous Breathing  Post-op Assessment: Report given to RN and Post -op Vital signs reviewed and stable  Post vital signs: Reviewed and stable  Last Vitals:  Vitals Value Taken Time  BP 117/78 01/23/23 0850  Temp    Pulse 67 01/23/23 0853  Resp 13 01/23/23 0853  SpO2 97 % 01/23/23 0853  Vitals shown include unvalidated device data.  Last Pain:  Vitals:   01/23/23 0624  TempSrc: Oral  PainSc: 0-No pain      Patients Stated Pain Goal: 5 (01/23/23 5284)  Complications: No notable events documented.

## 2023-01-23 NOTE — Brief Op Note (Signed)
01/23/2023  8:44 AM  PATIENT:  Michelle Ortiz  32 y.o. female  PRE-OPERATIVE DIAGNOSIS:  desire for permanent sterilization, Nexplanon in place  POST-OPERATIVE DIAGNOSIS:  desire for permanent sterilization   PROCEDURE:  laparoscopic bilateral salpingectomy, Nexplanon removal, cervical laceration repair  SURGEON:  Surgeon(s) and Role:    * Gianlucca Szymborski, Terrance Mass, MD - Primary    * Carrington Clamp, MD - Assisting   ANESTHESIA:   local and general  EBL:  20 mL   BLOOD ADMINISTERED:none  DRAINS: Urinary Catheter (Foley)   LOCAL MEDICATIONS USED:  MARCAINE     SPECIMEN:  Source of Specimen:  bilateral fallopian tubes  DISPOSITION OF SPECIMEN:  PATHOLOGY  COUNTS:  YES  TOURNIQUET:  * No tourniquets in log *  DICTATION: .Note written in EPIC  PLAN OF CARE: Discharge to home after PACU  PATIENT DISPOSITION:  PACU - hemodynamically stable.   Delay start of Pharmacological VTE agent (>24hrs) due to surgical blood loss or risk of bleeding: not applicable

## 2023-01-24 ENCOUNTER — Encounter (HOSPITAL_BASED_OUTPATIENT_CLINIC_OR_DEPARTMENT_OTHER): Payer: Self-pay | Admitting: Obstetrics and Gynecology

## 2023-01-24 LAB — SURGICAL PATHOLOGY

## 2023-08-11 IMAGING — US US OB < 14 WEEKS - US OB TV
1 series · 15 of 28 positions shown · non-contrast
Comparison: None.

CLINICAL DATA: Pelvic pain and cramping. LMP: 05/19/2021
corresponding to an estimated gestational age of 5 weeks, 4 days.

EXAM:
OBSTETRIC <14 WK US AND TRANSVAGINAL OB US
TECHNIQUE: Both transabdominal and transvaginal ultrasound examinations were
performed for complete evaluation of the gestation as well as the
maternal uterus, adnexal regions, and pelvic cul-de-sac.
Transvaginal technique was performed to assess early pregnancy.

[Series 1: us ob < 14 weeks - us ob tv · 15 of 40 slices shown]
[im 1/40]
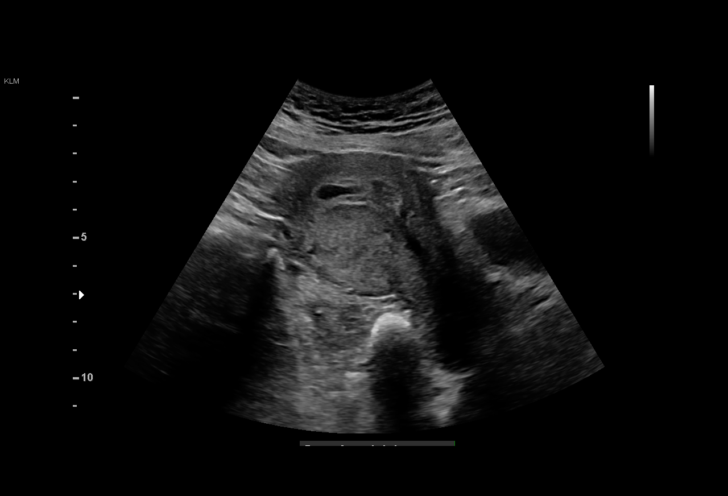
[im 3/40]
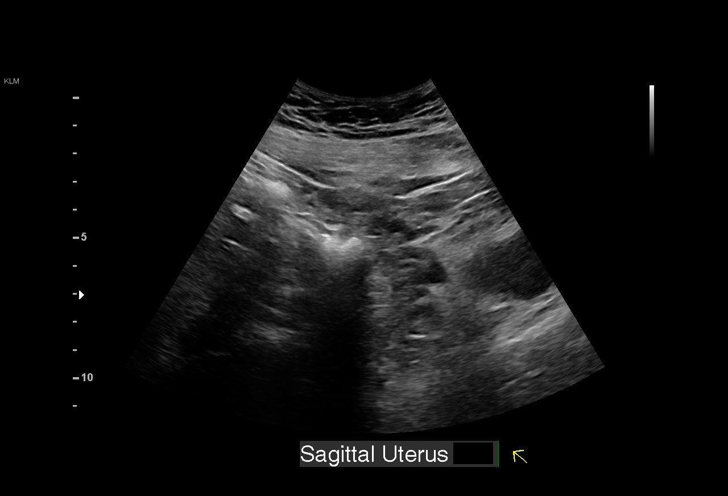
[im 6/40]
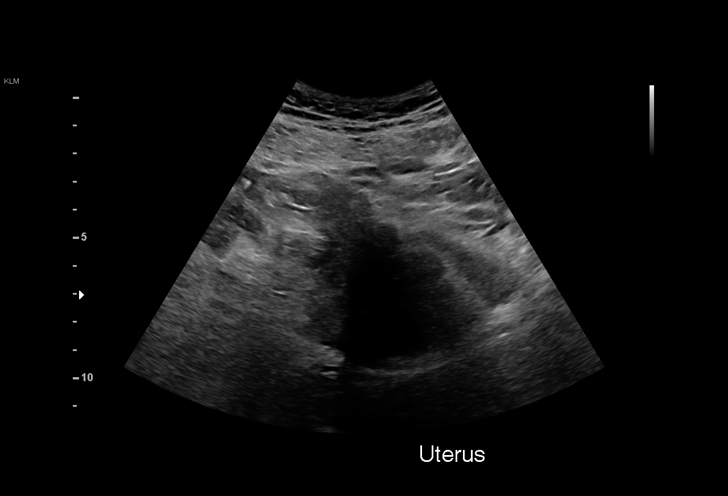
[im 9/40]
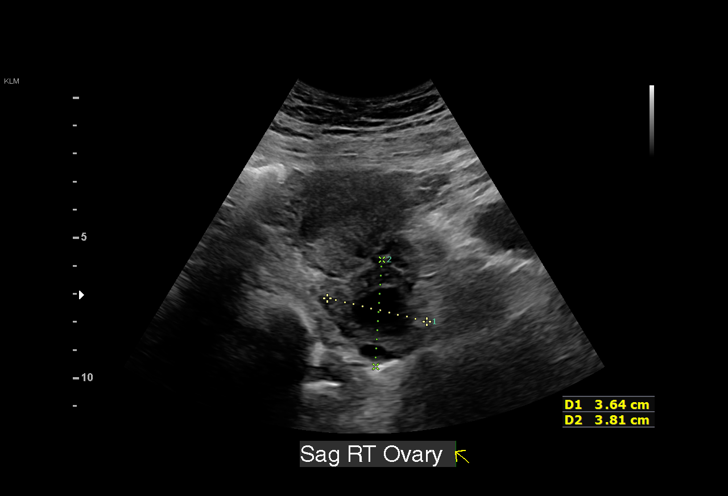
[im 12/40]
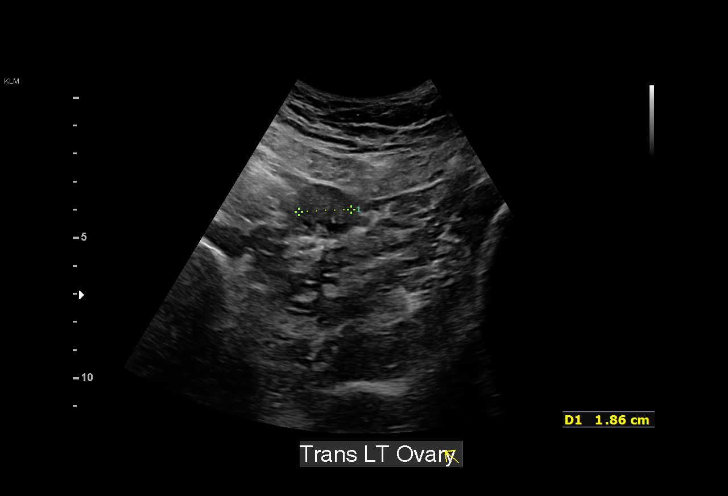
[im 15/40]
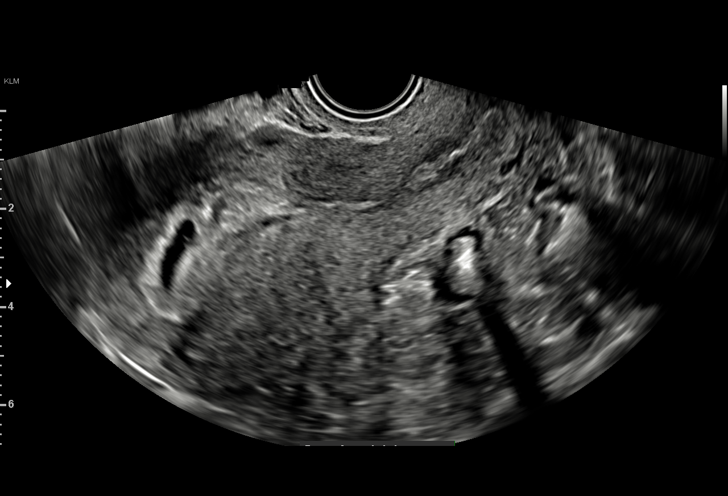
[im 18/40]
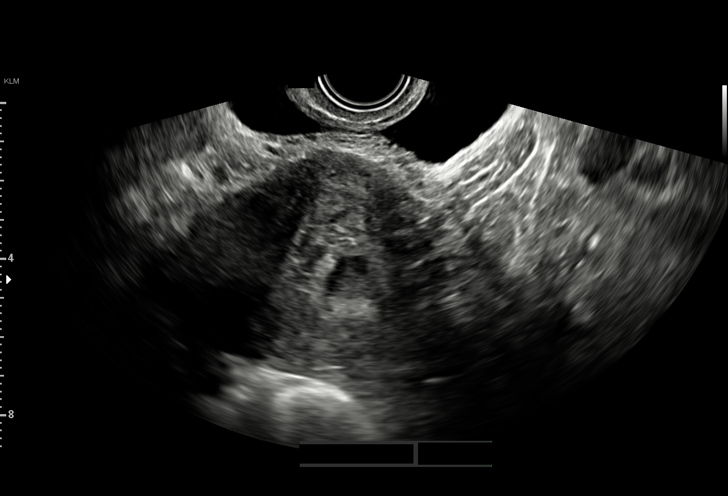
[im 21/40]
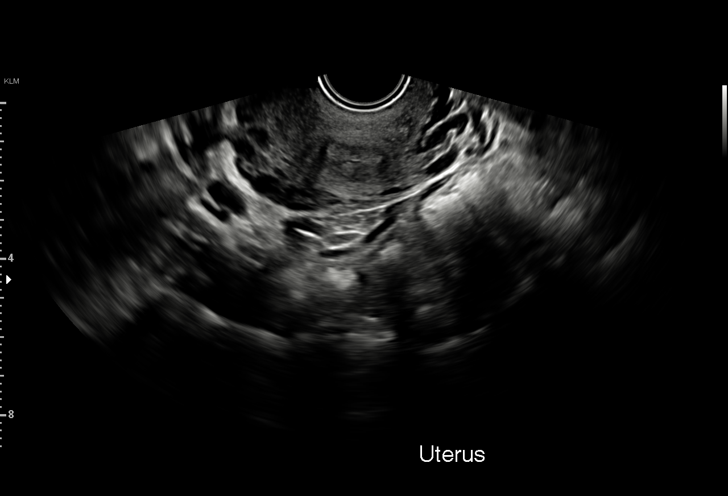
[im 22/40]
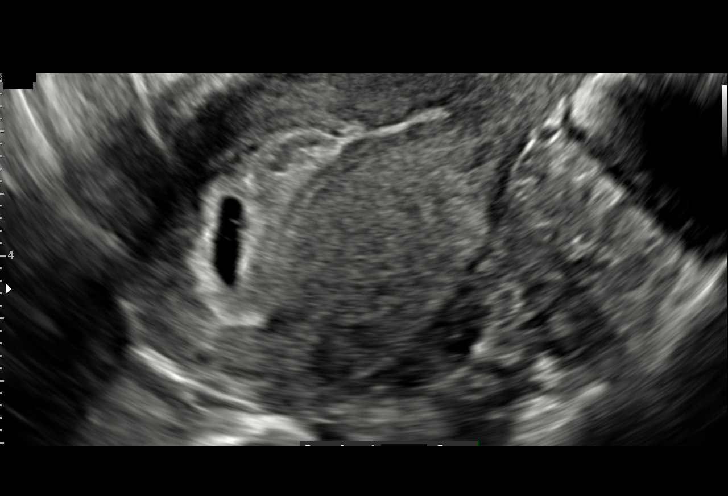
[im 25/40]
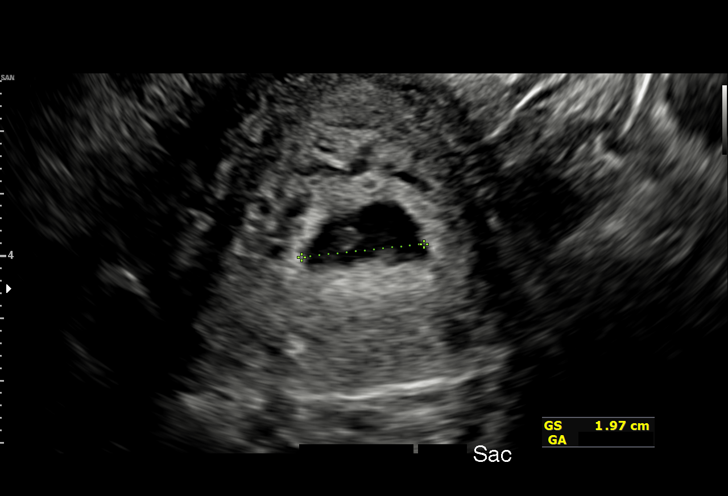
[im 28/40]
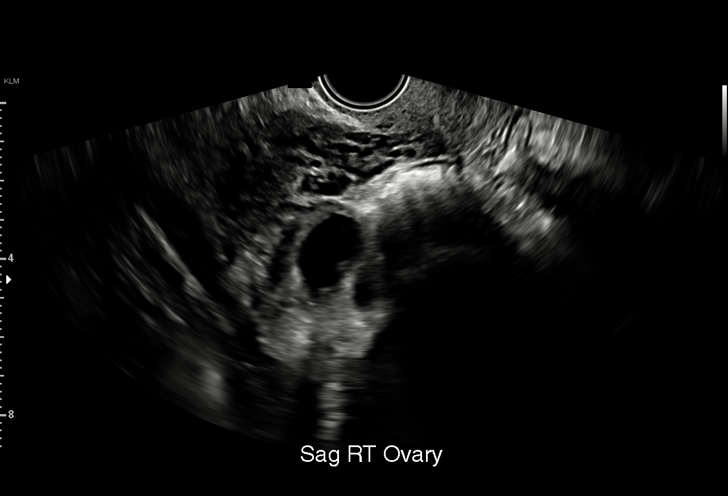
[im 31/40]
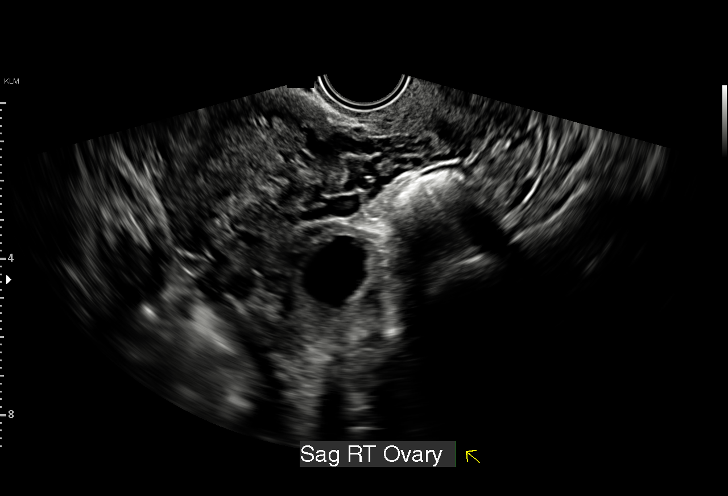
[im 34/40]
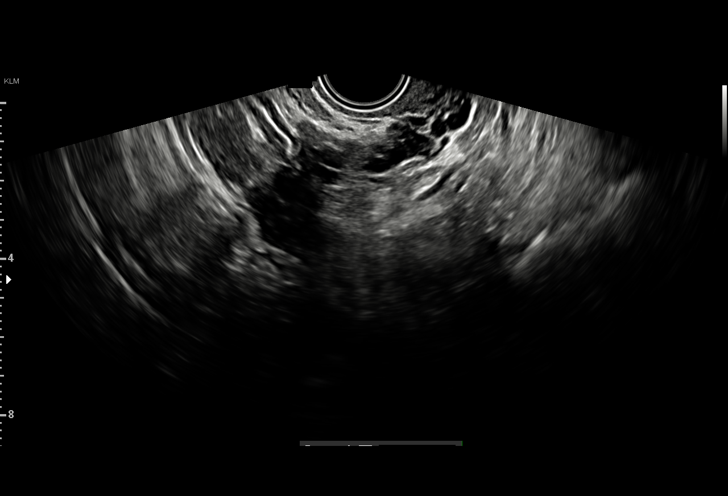
[im 37/40]
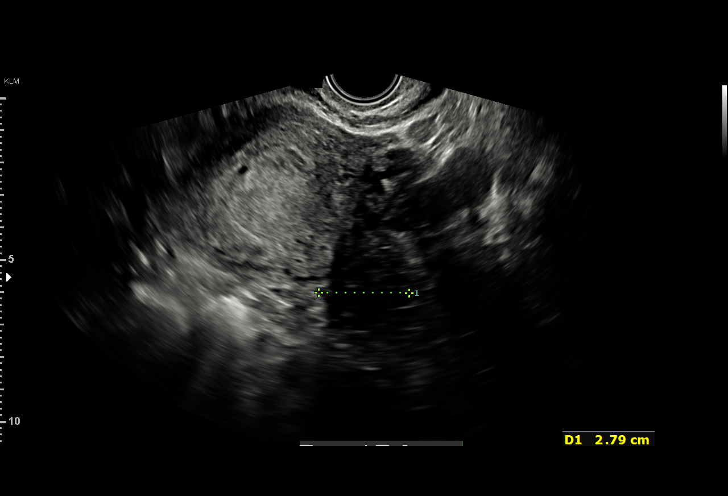
[im 40/40]
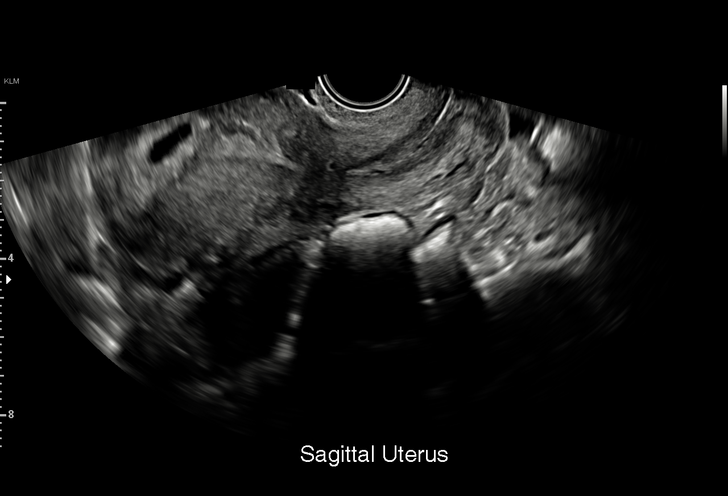

[15 of 28 positions shown; findings below may reference images not displayed]

FINDINGS: Intrauterine gestational sac: Single intrauterine gestational sac.

Yolk sac:  Seen

Embryo:  Not present

Cardiac Activity: N/A

MSD: 14 mm   6 w   2 d

Subchorionic hemorrhage:  None visualized.

Maternal uterus/adnexae: The maternal ovaries unremarkable.
IMPRESSION: Single intrauterine gestational sac with an estimated gestational
age of 6 weeks, 2 days. No fetal pole identified at this time.
Follow-up with ultrasound in 7-11 days, or earlier if clinically
indicated, recommended.

## 2024-08-12 ENCOUNTER — Other Ambulatory Visit: Payer: Self-pay | Admitting: Family Medicine

## 2024-08-12 DIAGNOSIS — R102 Pelvic and perineal pain unspecified side: Secondary | ICD-10-CM

## 2024-08-13 ENCOUNTER — Encounter: Payer: Self-pay | Admitting: Family Medicine

## 2024-08-19 ENCOUNTER — Ambulatory Visit
Admission: RE | Admit: 2024-08-19 | Discharge: 2024-08-19 | Disposition: A | Discharge status: Home / Self Care | Payer: Self-pay | Source: Ambulatory Visit | Attending: Family Medicine | Admitting: Family Medicine

## 2024-08-19 DIAGNOSIS — R102 Pelvic and perineal pain unspecified side: Secondary | ICD-10-CM

## 2024-08-19 MED ORDER — IOPAMIDOL (ISOVUE-370) INJECTION 76%
125.0000 mL | Freq: Once | INTRAVENOUS | Status: AC | PRN
  Administered 2024-08-19: 125 mL via INTRAVENOUS

## 2024-08-26 ENCOUNTER — Other Ambulatory Visit: Payer: Self-pay | Admitting: Physician Assistant

## 2024-08-26 ENCOUNTER — Other Ambulatory Visit: Payer: Self-pay | Admitting: Family Medicine

## 2024-08-26 DIAGNOSIS — N9489 Other specified conditions associated with female genital organs and menstrual cycle: Secondary | ICD-10-CM

## 2024-09-10 ENCOUNTER — Other Ambulatory Visit
